# Patient Record
Sex: Female | Born: 1950
Health system: Southern US, Community
[De-identification: ages and names within clinical notes are randomized; demographics above are authoritative.]

## PROBLEM LIST (undated history)

## (undated) DIAGNOSIS — R112 Nausea with vomiting, unspecified: Secondary | ICD-10-CM

## (undated) DIAGNOSIS — Z973 Presence of spectacles and contact lenses: Secondary | ICD-10-CM

## (undated) DIAGNOSIS — T4145XA Adverse effect of unspecified anesthetic, initial encounter: Secondary | ICD-10-CM

## (undated) DIAGNOSIS — Z9889 Other specified postprocedural states: Secondary | ICD-10-CM

## (undated) DIAGNOSIS — Z460 Encounter for fitting and adjustment of spectacles and contact lenses: Secondary | ICD-10-CM

## (undated) DIAGNOSIS — Z789 Other specified health status: Secondary | ICD-10-CM

## (undated) DIAGNOSIS — K469 Unspecified abdominal hernia without obstruction or gangrene: Secondary | ICD-10-CM

## (undated) DIAGNOSIS — T8859XA Other complications of anesthesia, initial encounter: Secondary | ICD-10-CM

## (undated) HISTORY — PX: TONSILLECTOMY: SUR1361

## (undated) HISTORY — DX: Unspecified abdominal hernia without obstruction or gangrene: K46.9

## (undated) HISTORY — PX: CYST EXCISION: SHX5701

## (undated) HISTORY — DX: Presence of spectacles and contact lenses: Z97.3

---

## 1988-07-23 HISTORY — PX: DILATION AND CURETTAGE OF UTERUS: SHX78

## 1993-07-23 HISTORY — PX: CYSTECTOMY: SUR359

## 2003-12-16 ENCOUNTER — Inpatient Hospital Stay (HOSPITAL_COMMUNITY): Admission: AD | Admit: 2003-12-16 | Discharge: 2003-12-18 | Payer: Self-pay | Admitting: Psychiatry

## 2003-12-16 ENCOUNTER — Emergency Department (HOSPITAL_COMMUNITY): Admission: EM | Admit: 2003-12-16 | Discharge: 2003-12-16 | Payer: Self-pay | Admitting: Emergency Medicine

## 2005-04-24 ENCOUNTER — Encounter: Admission: RE | Admit: 2005-04-24 | Discharge: 2005-04-24 | Payer: Self-pay | Admitting: Orthopedic Surgery

## 2010-08-13 ENCOUNTER — Encounter: Payer: Self-pay | Admitting: Orthopedic Surgery

## 2011-01-10 ENCOUNTER — Encounter (INDEPENDENT_AMBULATORY_CARE_PROVIDER_SITE_OTHER): Payer: Self-pay | Admitting: General Surgery

## 2011-01-30 ENCOUNTER — Encounter: Payer: Self-pay | Admitting: Family Medicine

## 2012-05-06 ENCOUNTER — Ambulatory Visit (INDEPENDENT_AMBULATORY_CARE_PROVIDER_SITE_OTHER): Payer: BC Managed Care – PPO | Admitting: General Surgery

## 2012-05-06 ENCOUNTER — Encounter (INDEPENDENT_AMBULATORY_CARE_PROVIDER_SITE_OTHER): Payer: Self-pay | Admitting: General Surgery

## 2012-05-06 ENCOUNTER — Telehealth (INDEPENDENT_AMBULATORY_CARE_PROVIDER_SITE_OTHER): Payer: Self-pay | Admitting: General Surgery

## 2012-05-06 VITALS — BP 134/76 | HR 72 | Temp 98.2°F | Resp 16 | Ht 64.0 in | Wt 95.6 lb

## 2012-05-06 DIAGNOSIS — K4090 Unilateral inguinal hernia, without obstruction or gangrene, not specified as recurrent: Secondary | ICD-10-CM

## 2012-05-06 DIAGNOSIS — M6740 Ganglion, unspecified site: Secondary | ICD-10-CM

## 2012-05-06 DIAGNOSIS — IMO0002 Reserved for concepts with insufficient information to code with codable children: Secondary | ICD-10-CM

## 2012-05-06 NOTE — Telephone Encounter (Signed)
LMOM letting pt know that she has an appt w/ Dr. Teressa Senter on 10/21 at 10:00.

## 2012-05-06 NOTE — Progress Notes (Signed)
Patient ID: Mindy Wilson, female   DOB: [birth year], 61 y.o.   MRN: [id number]  Chief Complaint  Patient presents with  . Cyst    eval cyst on hand and in groin    HPI Mindy Wilson is a 61 y.o. female.   HPI This is a 61 year old female who I saw in the summer of 2012 for a sebaceous cyst at that time. She also had a lesion in her left groin I was not entirely sure if this was a lipoma or hernia. At that time I recommended excision or repair of this. She did not want to do this at this at that time. She comes back in today with a complaint of a left groin mass that has increased in size over that time. It is not really tender. It is somewhat go away when she goes to sleep at night. She is having normal bowel movements without any difficulty. The sebaceous cyst on her back has not recurred and she has no problems with his right now. She also has a cyst on the dorsum of her right hand that is increasing in size. Past Medical History  Diagnosis Date  . Wears glasses   . Hernia   . Osteoporosis     Past Surgical History  Procedure Date  . Cystectomy 1995  . Dilation and curettage of uterus 1990    Family History  Problem Relation Age of Onset  . Cancer Mother     breast  . Cancer Maternal Uncle     colon    Social History History  Substance Use Topics  . Smoking status: Current Every Day Smoker -- 1.0 packs/day  . Smokeless tobacco: Not on file  . Alcohol Use: Yes     very little    Allergies  Allergen Reactions  . Sulfur     No current outpatient prescriptions on file.    Review of Systems Review of Systems  Constitutional: Negative for fever, chills and unexpected weight change.  HENT: Negative for hearing loss, congestion, sore throat, trouble swallowing and voice change.   Eyes: Negative for visual disturbance.  Respiratory: Negative for cough and wheezing.   Cardiovascular: Negative for chest pain, palpitations and leg swelling.  Gastrointestinal: Negative for  nausea, vomiting, abdominal pain, diarrhea, constipation, blood in stool, abdominal distention and anal bleeding.  Genitourinary: Negative for hematuria, vaginal bleeding and difficulty urinating.  Musculoskeletal: Negative for arthralgias.  Skin: Negative for rash and wound.  Neurological: Negative for seizures, syncope and headaches.  Hematological: Negative for adenopathy. Does not bruise/bleed easily.  Psychiatric/Behavioral: Negative for confusion.    Blood pressure 134/76, pulse 72, temperature 98.2 F (36.8 C), temperature source Temporal, resp. rate 16, height 5\' 4"  (1.626 m), weight 95 lb 9.6 oz (43.364 kg).  Physical Exam Physical Exam  Vitals reviewed. Constitutional: She appears well-developed and well-nourished.  Cardiovascular: Normal rate, regular rhythm and normal heart sounds.   Pulmonary/Chest: Effort normal and breath sounds normal. She has no wheezes. She has no rales.  Abdominal: Soft. Normal appearance and bowel sounds are normal. There is no tenderness. A hernia is present. Hernia confirmed positive in the left inguinal area (I am not entirely this is hernia or lipoma, may very well be a left groin hernia, nontender, very mobile thought). Hernia confirmed negative in the right inguinal area.    Data Reviewed Prior office notes  Assessment    Left groin mass, possible hernia    Plan    She would like the  area on her hand excised. I'm going to refer her to hand surgery for evaluation to remove this. I will wait to hear back from her after she sees them.  We also discussed a left groin exploration. I discussed with her if this is a lipoma I will excise it. We also discussed the open inguinal hernia repair if there is indeed a hernia there. I told her I think we should do this in the fairly near future as if it is a hernia she certainly has some risk associated with it.  We discussed  an open inguinal hernia repair. I described the procedure in detail.  The  patient was given educational material.  Goals should be achieved with surgery. We discussed the usage of mesh and the rationale behind that. We went over the pathophysiology of inguinal hernia. We have elected to perform open inguinal hernia repair with mesh.  We discussed the risks including bleeding, infection, recurrence, postoperative pain and chronic groin pain.         Mindy Wilson 05/06/2012, 1:54 PM

## 2012-05-13 ENCOUNTER — Other Ambulatory Visit (INDEPENDENT_AMBULATORY_CARE_PROVIDER_SITE_OTHER): Payer: Self-pay | Admitting: General Surgery

## 2012-06-23 ENCOUNTER — Encounter (HOSPITAL_BASED_OUTPATIENT_CLINIC_OR_DEPARTMENT_OTHER): Payer: Self-pay | Admitting: *Deleted

## 2012-06-23 NOTE — Progress Notes (Signed)
To come in for [facility] labs Denies any heart or resp problems Told to hydrate well-hx bp low with anesthesia

## 2012-06-24 ENCOUNTER — Encounter (HOSPITAL_BASED_OUTPATIENT_CLINIC_OR_DEPARTMENT_OTHER)
Admission: RE | Admit: 2012-06-24 | Discharge: 2012-06-24 | Disposition: A | Discharge status: Home / Self Care | Payer: BC Managed Care – PPO | Source: Ambulatory Visit | Attending: General Surgery | Admitting: General Surgery

## 2012-06-24 LAB — BASIC METABOLIC PANEL
BUN: 20 mg/dL (ref 6–23)
CO2: 27 mEq/L (ref 19–32)
Calcium: 9.7 mg/dL (ref 8.4–10.5)
Chloride: 103 mEq/L (ref 96–112)
Creatinine, Ser: 0.77 mg/dL (ref 0.50–1.10)
GFR calc Af Amer: >90 mL/min (ref >90)
GFR calc non Af Amer: 89 mL/min — ABNORMAL LOW (ref >90)
Glucose, Bld: 64 mg/dL — ABNORMAL LOW (ref 70–99)
Potassium: 3.9 mEq/L (ref 3.5–5.1)
Sodium: 140 mEq/L (ref 135–145)

## 2012-06-24 LAB — CBC
HCT: 41.2 % (ref 36.0–46.0)
Hemoglobin: 14.2 g/dL (ref 12.0–15.0)
MCH: 32.7 pg (ref 26.0–34.0)
MCHC: 34.5 g/dL (ref 30.0–36.0)
MCV: 94.9 fL (ref 78.0–100.0)
Platelets: 237 10*3/uL (ref 150–400)
RBC: 4.34 MIL/uL (ref 3.87–5.11)
RDW: 13.2 % (ref 11.5–15.5)
WBC: 6.1 10*3/uL (ref 4.0–10.5)

## 2012-06-24 LAB — DIFFERENTIAL
Basophils Absolute: 0 10*3/uL (ref 0.0–0.1)
Basophils Relative: 1 % (ref 0–1)
Eosinophils Absolute: 0.1 10*3/uL (ref 0.0–0.7)
Eosinophils Relative: 2 % (ref 0–5)
Lymphocytes Relative: 32 % (ref 12–46)
Lymphs Abs: 2 10*3/uL (ref 0.7–4.0)
Monocytes Absolute: 0.7 10*3/uL (ref 0.1–1.0)
Monocytes Relative: 12 % (ref 3–12)
Neutro Abs: 3.3 10*3/uL (ref 1.7–7.7)
Neutrophils Relative %: 54 % (ref 43–77)

## 2012-06-25 ENCOUNTER — Encounter (HOSPITAL_BASED_OUTPATIENT_CLINIC_OR_DEPARTMENT_OTHER): Payer: Self-pay

## 2012-06-25 ENCOUNTER — Encounter (HOSPITAL_BASED_OUTPATIENT_CLINIC_OR_DEPARTMENT_OTHER)
Admission: RE | Disposition: A | Discharge status: Home / Self Care | Payer: Self-pay | Source: Ambulatory Visit | Attending: General Surgery

## 2012-06-25 ENCOUNTER — Encounter (HOSPITAL_BASED_OUTPATIENT_CLINIC_OR_DEPARTMENT_OTHER): Payer: Self-pay | Admitting: Certified Registered Nurse Anesthetist

## 2012-06-25 ENCOUNTER — Encounter (HOSPITAL_BASED_OUTPATIENT_CLINIC_OR_DEPARTMENT_OTHER): Payer: Self-pay | Admitting: Anesthesiology

## 2012-06-25 ENCOUNTER — Ambulatory Visit (HOSPITAL_BASED_OUTPATIENT_CLINIC_OR_DEPARTMENT_OTHER): Payer: BC Managed Care – PPO | Admitting: Certified Registered Nurse Anesthetist

## 2012-06-25 ENCOUNTER — Ambulatory Visit (HOSPITAL_BASED_OUTPATIENT_CLINIC_OR_DEPARTMENT_OTHER)
Admission: RE | Admit: 2012-06-25 | Discharge: 2012-06-25 | Disposition: A | Discharge status: Home / Self Care | Payer: BC Managed Care – PPO | Source: Ambulatory Visit | Attending: General Surgery | Admitting: General Surgery

## 2012-06-25 DIAGNOSIS — Z800 Family history of malignant neoplasm of digestive organs: Secondary | ICD-10-CM | POA: Insufficient documentation

## 2012-06-25 DIAGNOSIS — M810 Age-related osteoporosis without current pathological fracture: Secondary | ICD-10-CM | POA: Insufficient documentation

## 2012-06-25 DIAGNOSIS — K4190 Unilateral femoral hernia, without obstruction or gangrene, not specified as recurrent: Secondary | ICD-10-CM

## 2012-06-25 DIAGNOSIS — K4090 Unilateral inguinal hernia, without obstruction or gangrene, not specified as recurrent: Secondary | ICD-10-CM

## 2012-06-25 DIAGNOSIS — Z882 Allergy status to sulfonamides status: Secondary | ICD-10-CM | POA: Insufficient documentation

## 2012-06-25 DIAGNOSIS — F17200 Nicotine dependence, unspecified, uncomplicated: Secondary | ICD-10-CM | POA: Insufficient documentation

## 2012-06-25 DIAGNOSIS — Z803 Family history of malignant neoplasm of breast: Secondary | ICD-10-CM | POA: Insufficient documentation

## 2012-06-25 HISTORY — DX: Encounter for fitting and adjustment of spectacles and contact lenses: Z46.0

## 2012-06-25 HISTORY — PX: INSERTION OF MESH: SHX5868

## 2012-06-25 HISTORY — DX: Adverse effect of unspecified anesthetic, initial encounter: T41.45XA

## 2012-06-25 HISTORY — DX: Other specified postprocedural states: R11.2

## 2012-06-25 HISTORY — DX: Other complications of anesthesia, initial encounter: T88.59XA

## 2012-06-25 HISTORY — DX: Nausea with vomiting, unspecified: Z98.890

## 2012-06-25 HISTORY — PX: INGUINAL HERNIA REPAIR: SHX194

## 2012-06-25 HISTORY — DX: Other specified health status: Z78.9

## 2012-06-25 SURGERY — REPAIR, HERNIA, INGUINAL, ADULT
Anesthesia: Regional | Laterality: Left | Wound class: Clean

## 2012-06-25 MED ORDER — 0.9 % SODIUM CHLORIDE (POUR BTL) OPTIME
TOPICAL | Status: DC | PRN
  Administered 2012-06-25: 1000 mL

## 2012-06-25 MED ORDER — BUPIVACAINE HCL (PF) 0.25 % IJ SOLN
INTRAMUSCULAR | Status: DC | PRN
  Administered 2012-06-25: 6 mL

## 2012-06-25 MED ORDER — BUPIVACAINE-EPINEPHRINE PF 0.5-1:200000 % IJ SOLN
INTRAMUSCULAR | Status: DC | PRN
  Administered 2012-06-25: 25 mL

## 2012-06-25 MED ORDER — ONDANSETRON HCL 4 MG/2ML IJ SOLN
INTRAMUSCULAR | Status: DC | PRN
  Administered 2012-06-25: 4 mg via INTRAVENOUS

## 2012-06-25 MED ORDER — EPHEDRINE SULFATE 50 MG/ML IJ SOLN
INTRAMUSCULAR | Status: DC | PRN
  Administered 2012-06-25: 10 mg via INTRAVENOUS

## 2012-06-25 MED ORDER — SCOPOLAMINE 1 MG/3DAYS TD PT72
1.0000 | MEDICATED_PATCH | TRANSDERMAL | Status: DC
  Administered 2012-06-25: 1.5 mg via TRANSDERMAL

## 2012-06-25 MED ORDER — LIDOCAINE HCL (CARDIAC) 20 MG/ML IV SOLN
INTRAVENOUS | Status: DC | PRN
  Administered 2012-06-25: 60 mg via INTRAVENOUS

## 2012-06-25 MED ORDER — HYDROMORPHONE HCL PF 1 MG/ML IJ SOLN
0.2500 mg | INTRAMUSCULAR | Status: DC | PRN
  Administered 2012-06-25: 0.5 mg via INTRAVENOUS

## 2012-06-25 MED ORDER — FENTANYL CITRATE 0.05 MG/ML IJ SOLN
50.0000 ug | INTRAMUSCULAR | Status: DC | PRN
  Administered 2012-06-25: 50 ug via INTRAVENOUS

## 2012-06-25 MED ORDER — DEXAMETHASONE SODIUM PHOSPHATE 4 MG/ML IJ SOLN
INTRAMUSCULAR | Status: DC | PRN
  Administered 2012-06-25: 10 mg via INTRAVENOUS

## 2012-06-25 MED ORDER — LACTATED RINGERS IV SOLN
INTRAVENOUS | Status: DC
  Administered 2012-06-25 (×2): via INTRAVENOUS

## 2012-06-25 MED ORDER — CEFAZOLIN SODIUM-DEXTROSE 2-3 GM-% IV SOLR
2.0000 g | INTRAVENOUS | Status: AC
  Administered 2012-06-25: 2 g via INTRAVENOUS

## 2012-06-25 MED ORDER — MIDAZOLAM HCL 5 MG/5ML IJ SOLN
INTRAMUSCULAR | Status: DC | PRN
  Administered 2012-06-25: 1 mg via INTRAVENOUS

## 2012-06-25 MED ORDER — MIDAZOLAM HCL 2 MG/2ML IJ SOLN
0.5000 mg | INTRAMUSCULAR | Status: DC | PRN
  Administered 2012-06-25: 2 mg via INTRAVENOUS

## 2012-06-25 MED ORDER — ACETAMINOPHEN 10 MG/ML IV SOLN
1000.0000 mg | Freq: Once | INTRAVENOUS | Status: AC
  Administered 2012-06-25: 900 mg via INTRAVENOUS

## 2012-06-25 MED ORDER — OXYCODONE HCL 5 MG PO TABS: 5.0000 mg | ORAL_TABLET | Freq: Once | ORAL | Status: DC | PRN

## 2012-06-25 MED ORDER — HYDROCODONE-ACETAMINOPHEN 10-325 MG PO TABS: 1.0000 | ORAL_TABLET | Freq: Four times a day (QID) | ORAL | Status: DC | PRN

## 2012-06-25 MED ORDER — FENTANYL CITRATE 0.05 MG/ML IJ SOLN
INTRAMUSCULAR | Status: DC | PRN
  Administered 2012-06-25: 25 ug via INTRAVENOUS

## 2012-06-25 MED ORDER — PROPOFOL 10 MG/ML IV BOLUS
INTRAVENOUS | Status: DC | PRN
  Administered 2012-06-25: 150 mg via INTRAVENOUS

## 2012-06-25 MED ORDER — OXYCODONE HCL 5 MG/5ML PO SOLN: 5.0000 mg | Freq: Once | ORAL | Status: DC | PRN

## 2012-06-25 SURGICAL SUPPLY — 42 items
BLADE SURG 15 STRL LF DISP TIS (BLADE) ×1 IMPLANT
BLADE SURG 15 STRL SS (BLADE) ×1
BLADE SURG ROTATE 9660 (MISCELLANEOUS) IMPLANT
CHLORAPREP W/TINT 26ML (MISCELLANEOUS) ×2 IMPLANT
CLOTH BEACON ORANGE TIMEOUT ST (SAFETY) ×2 IMPLANT
COVER MAYO STAND STRL (DRAPES) ×2 IMPLANT
COVER TABLE BACK 60X90 (DRAPES) ×2 IMPLANT
DECANTER SPIKE VIAL GLASS SM (MISCELLANEOUS) IMPLANT
DERMABOND ADVANCED (GAUZE/BANDAGES/DRESSINGS) ×1
DERMABOND ADVANCED .7 DNX12 (GAUZE/BANDAGES/DRESSINGS) ×1 IMPLANT
DRAIN PENROSE 1/2X12 LTX STRL (WOUND CARE) ×2 IMPLANT
DRAPE PED LAPAROTOMY (DRAPES) ×2 IMPLANT
ELECT COATED BLADE 2.86 ST (ELECTRODE) ×2 IMPLANT
ELECT REM PT RETURN 9FT ADLT (ELECTROSURGICAL) ×2
ELECTRODE REM PT RTRN 9FT ADLT (ELECTROSURGICAL) ×1 IMPLANT
GLOVE BIO SURGEON STRL SZ 6.5 (GLOVE) ×2 IMPLANT
GLOVE BIO SURGEON STRL SZ7 (GLOVE) ×2 IMPLANT
GLOVE BIOGEL PI IND STRL 7.5 (GLOVE) ×1 IMPLANT
GLOVE BIOGEL PI INDICATOR 7.5 (GLOVE) ×1
GLOVE INDICATOR 7.0 STRL GRN (GLOVE) ×2 IMPLANT
GOWN PREVENTION PLUS XLARGE (GOWN DISPOSABLE) ×4 IMPLANT
MESH HERNIA SYS ULTRAPRO LRG (Mesh General) ×2 IMPLANT
NEEDLE HYPO 22GX1.5 SAFETY (NEEDLE) ×2 IMPLANT
NS IRRIG 1000ML POUR BTL (IV SOLUTION) ×2 IMPLANT
PACK BASIN DAY SURGERY FS (CUSTOM PROCEDURE TRAY) ×2 IMPLANT
PENCIL BUTTON HOLSTER BLD 10FT (ELECTRODE) ×2 IMPLANT
SLEEVE SCD COMPRESS KNEE MED (MISCELLANEOUS) IMPLANT
SPONGE LAP 4X18 X RAY DECT (DISPOSABLE) ×2 IMPLANT
STRIP CLOSURE SKIN 1/2X4 (GAUZE/BANDAGES/DRESSINGS) IMPLANT
SUT MNCRL AB 4-0 PS2 18 (SUTURE) ×2 IMPLANT
SUT PROLENE 2 0 CT2 30 (SUTURE) ×4 IMPLANT
SUT SILK 2 0 SH (SUTURE) IMPLANT
SUT VIC AB 0 SH 27 (SUTURE) IMPLANT
SUT VIC AB 2-0 SH 27 (SUTURE) ×2
SUT VIC AB 2-0 SH 27XBRD (SUTURE) ×2 IMPLANT
SUT VIC AB 3-0 SH 27 (SUTURE) ×1
SUT VIC AB 3-0 SH 27X BRD (SUTURE) ×1 IMPLANT
SUT VICRYL AB 3 0 TIES (SUTURE) ×2 IMPLANT
SYR CONTROL 10ML LL (SYRINGE) ×2 IMPLANT
TOWEL OR 17X24 6PK STRL BLUE (TOWEL DISPOSABLE) ×4 IMPLANT
TOWEL OR NON WOVEN STRL DISP B (DISPOSABLE) ×2 IMPLANT
WATER STERILE IRR 1000ML POUR (IV SOLUTION) IMPLANT

## 2012-06-25 NOTE — Progress Notes (Signed)
Assisted Dr. Sampson Goon with left, ultrasound guided transabd plane block. Side rails up, monitors on throughout procedure. See vital signs in flow sheet. Tolerated Procedure well.

## 2012-06-25 NOTE — Anesthesia Postprocedure Evaluation (Signed)
  Anesthesia Post-op Note  Patient: Mindy Wilson  Procedure(s) Performed: Procedure(s) (LRB) with comments: HERNIA REPAIR INGUINAL ADULT (Left) INSERTION OF MESH (Left)  Patient Location: PACU  Anesthesia Type:GA combined with regional for post-op pain  Level of Consciousness: awake and alert   Airway and Oxygen Therapy: Patient Spontanous Breathing and Patient connected to face mask oxygen  Post-op Pain: mild  Post-op Assessment: Post-op Vital signs reviewed, Patient's Cardiovascular Status Stable, Respiratory Function Stable, Patent Airway and No signs of Nausea or vomiting  Post-op Vital Signs: Reviewed and stable  Complications: No apparent anesthesia complications

## 2012-06-25 NOTE — Anesthesia Preprocedure Evaluation (Signed)
Anesthesia Evaluation  Patient identified by MRN, date of birth, ID band Patient awake    Reviewed: Allergy & Precautions, H&P , NPO status , Patient's Chart, lab work & pertinent test results  History of Anesthesia Complications (+) PONV  Airway Mallampati: II TM Distance: >3 FB Neck ROM: Full    Dental No notable dental hx. (+) Teeth Intact and Dental Advisory Given   Pulmonary neg pulmonary ROS,  breath sounds clear to auscultation  Pulmonary exam normal       Cardiovascular negative cardio ROS  Rhythm:Regular Rate:Normal     Neuro/Psych negative neurological ROS  negative psych ROS   GI/Hepatic negative GI ROS, Neg liver ROS,   Endo/Other  negative endocrine ROS  Renal/GU negative Renal ROS  negative genitourinary   Musculoskeletal   Abdominal   Peds  Hematology negative hematology ROS (+)   Anesthesia Other Findings   Reproductive/Obstetrics negative OB ROS                           Anesthesia Physical Anesthesia Plan  ASA: II  Anesthesia Plan: General and Regional   Post-op Pain Management:    Induction: Intravenous  Airway Management Planned: LMA  Additional Equipment:   Intra-op Plan:   Post-operative Plan: Extubation in OR  Informed Consent: I have reviewed the patients History and Physical, chart, labs and discussed the procedure including the risks, benefits and alternatives for the proposed anesthesia with the patient or authorized representative who has indicated his/her understanding and acceptance.   Dental advisory given  Plan Discussed with: CRNA  Anesthesia Plan Comments:         Anesthesia Quick Evaluation

## 2012-06-25 NOTE — Op Note (Signed)
Preoperative diagnosis: Left inguinal hernia Postoperative diagnosis: #1 left inguinal hernia #2 left femoral hernia Procedure: Femoral and inguinal hernia repair with UltraPro hernia system Surgeon: Dr. Harden Mo Anesthesia: Gen. With LMA, Block Estimated blood loss: Minimal Complications: None Specimens: None Sponge and needle count correct at end of operation Disposition to recovery stable  Indications: This is a 61 year old female with a long-standing history of a left groin mass. This area clinically and on exam appears to be a hernia. We discussed a left inguinal hernia repair with mesh. We discussed the risks and benefits prior to beginning.  Procedure: After informed consent was obtained the patient first had a TAP block placed by Dr. Sampson Goon. She was then taken to the operating room. She had cefazolin ministered. She has sequential compression devices on her legs. She was in place a general anesthesia with an LMA. Her left groin was then prepped and draped in the standard sterile surgical fashion. Surgical timeout was performed.  I infiltrated quarter percent Marcaine in the skin of the left groin. An incision. I ligated the superficial epigastric vein with 3-0 Vicryl ties. Then identified the external oblique. I entered this sharply through the external ring. I then ligated and divided the round ligament. I then noted her to have a pantaloon hernia that involved both the direct and indirect components surrounding the epigastric vessels. She had also a fairly large femoral hernia that reduced in its entirety. I then developed the preperitoneal space. This had reduced all of the hernia completely and was able to view the entire myopectineal orifice. I then decided to place an UltraPro hernia system. I placed the bottom portion of bilayer  flat in the preperitoneal space giving good coverage of my femoral space. I then closed the internal oblique fascia down to the bone. Laterally I  then transitioned this to the shelving edge. This completely obliterated the space. I closed the femoral defect down with a 2-0 Vicryl as well. I then laid the top portion of the bilayer flap. I sewed this to the pubic tubercle with 2-0 Prolene in several positions. I tacked this with 2-0 Prolene to the shelving edge throughout that length as well. I laid the lateral portions underneath the external oblique. I tacked it to the internal oblique superiorly the mesh was in good position. Hemostasis was observed. I then closed the external oblique with 2-0 Vicryl. I closed Scarpa's with 3-0 Vicryl. The skin was closed for Monocryl and Dermabond. She tolerated this well was extubated and transferred to recovery stable.

## 2012-06-25 NOTE — Interval H&P Note (Signed)
History and Physical Interval Note:  06/25/2012 10:03 AM  Mindy Wilson  has presented today for surgery, with the diagnosis of left groin mass  The various methods of treatment have been discussed with the patient and family. After consideration of risks, benefits and other options for treatment, the patient has consented to  Procedure(s) (LRB) with comments: HERNIA REPAIR INGUINAL ADULT (Left) - left inguinal hernia repair with mesh vs left groin mass excision INSERTION OF MESH (Left) as a surgical intervention .  The patient's history has been reviewed, patient examined, no change in status, stable for surgery.  I have reviewed the patient's chart and labs.  Questions were answered to the patient's satisfaction.     [name]

## 2012-06-25 NOTE — H&P (Signed)
HPI  This is a 61 year old female who I saw in the summer of 2012 for a sebaceous cyst at that time. She also had a lesion in her left groin I was not entirely sure if this was a lipoma or hernia. At that time I recommended excision or repair of this. She did not want to do this at this at that time. She comes back in today with a complaint of a left groin mass that has increased in size over that time. It is not really tender. It is somewhat go away when she goes to sleep at night. She is having normal bowel movements without any difficulty. The sebaceous cyst on her back has not recurred and she has no problems with his right now. She also has a cyst on the dorsum of her right hand that is increasing in size.   Past Medical History   Diagnosis  Date   .  Wears glasses    .  Hernia    .  Osteoporosis     Past Surgical History   Procedure  Date   .  Cystectomy  1995   .  Dilation and curettage of uterus  1990    Family History   Problem  Relation  Age of Onset   .  Cancer  Mother       breast    .  Cancer  Maternal Uncle       colon    Social History  History   Substance Use Topics   .  Smoking status:  Current Every Day Smoker -- 1.0 packs/day   .  Smokeless tobacco:  Not on file   .  Alcohol Use:  Yes      very little    Allergies   Allergen  Reactions   .  Sulfur     No current outpatient prescriptions on file.     Blood pressure 134/76, pulse 72, temperature 98.2 F (36.8 C), temperature source Temporal, resp. rate 16, height 5\' 4"  (1.626 m), weight 95 lb 9.6 oz (43.364 kg).  Physical Exam  Physical Exam  Vitals reviewed.  Constitutional: She appears well-developed and well-nourished.  Cardiovascular: Normal rate, regular rhythm and normal heart sounds.  Pulmonary/Chest: Effort normal and breath sounds normal. She has no wheezes. She has no rales.  Abdominal: Soft. Normal appearance and bowel sounds are normal. There is no tenderness. A hernia is present. Hernia  confirmed positive in the left inguinal area (I am not entirely this is hernia or lipoma, may very well be a left groin hernia, nontender, very mobile though). Hernia confirmed negative in the right inguinal area.   Left groin mass, possible hernia   Plan   She would like the area on her hand excised. I'm going to refer her to hand surgery for evaluation to remove this. I will wait to hear back from her after she sees them.  We also discussed a left groin exploration. I discussed with her if this is a lipoma I will excise it. We also discussed the open inguinal hernia repair if there is indeed a hernia there. I told her I think we should do this in the fairly near future as if it is a hernia she certainly has some risk associated with it.  We discussed an open inguinal hernia repair. I described the procedure in detail. The patient was given educational material. Goals should be achieved with surgery. We discussed the usage of mesh and the rationale behind  that. We went over the pathophysiology of inguinal hernia. We have elected to perform open inguinal hernia repair with mesh. We discussed the risks including bleeding, infection, recurrence, postoperative pain and chronic groin pain.

## 2012-06-25 NOTE — Anesthesia Procedure Notes (Addendum)
Anesthesia Regional Block:  TAP block  Pre-Anesthetic Checklist: ,, timeout performed, Correct Patient, Correct Site, Correct Laterality, Correct Procedure, Correct Position, site marked, Risks and benefits discussed, pre-op evaluation, post-op pain management  Laterality: Left  Prep: chloraprep       Needles:  Injection technique: Single-shot  Needle Type: Echogenic Stimulator Needle          Additional Needles:  Procedures: ultrasound guided (picture in chart) TAP block Narrative:  Start time: 06/25/2012 9:17 AM End time: 06/25/2012 9:27 AM Injection made incrementally with aspirations every 5 mL.  Performed by: Personally  Anesthesiologist: Fitzgerald,MD  Additional Notes: 2% Lidocaine for skin.   Procedure Name: LMA Insertion Date/Time: 06/25/2012 10:14 AM Performed by: Nasya Vincent D Pre-anesthesia Checklist: Patient identified, Emergency Drugs available, Suction available and Patient being monitored Patient Re-evaluated:Patient Re-evaluated prior to inductionOxygen Delivery Method: Circle System Utilized Preoxygenation: Pre-oxygenation with 100% oxygen Intubation Type: IV induction Ventilation: Mask ventilation without difficulty LMA: LMA inserted LMA Size: 4.0 Number of attempts: 1 Airway Equipment and Method: bite block Placement Confirmation: positive ETCO2 Tube secured with: Tape Dental Injury: Teeth and Oropharynx as per pre-operative assessment

## 2012-06-25 NOTE — Transfer of Care (Signed)
Immediate Anesthesia Transfer of Care Note  Patient: Mindy Wilson  Procedure(s) Performed: Procedure(s) (LRB) with comments: HERNIA REPAIR INGUINAL ADULT (Left) INSERTION OF MESH (Left)  Patient Location: PACU  Anesthesia Type:GA combined with regional for post-op pain  Level of Consciousness: sedated  Airway & Oxygen Therapy: Patient Spontanous Breathing and Patient connected to face mask oxygen  Post-op Assessment: Report given to PACU RN and Post -op Vital signs reviewed and stable  Post vital signs: Reviewed and stable  Complications: No apparent anesthesia complications

## 2012-06-26 ENCOUNTER — Encounter (HOSPITAL_BASED_OUTPATIENT_CLINIC_OR_DEPARTMENT_OTHER): Payer: Self-pay | Admitting: General Surgery

## 2012-07-11 ENCOUNTER — Ambulatory Visit (INDEPENDENT_AMBULATORY_CARE_PROVIDER_SITE_OTHER): Payer: BC Managed Care – PPO | Admitting: General Surgery

## 2012-07-11 ENCOUNTER — Encounter (INDEPENDENT_AMBULATORY_CARE_PROVIDER_SITE_OTHER): Payer: Self-pay | Admitting: General Surgery

## 2012-07-11 DIAGNOSIS — Z09 Encounter for follow-up examination after completed treatment for conditions other than malignant neoplasm: Secondary | ICD-10-CM

## 2012-07-11 NOTE — Progress Notes (Signed)
Subjective:     Patient ID: Mindy Wilson, female   DOB: [birth year], 61 y.o.   MRN: [id number]  HPI This is a 61 year old female who I repaired a left inguinal and femoral hernia on early in December. She reports some swelling as well as some numbness in this area but is otherwise doing well. She slowly is increasing her activity. She is having normal bowel movements and eating well.  Review of Systems     Objective:   Physical Exam    well healing left groin incision without infection, mild edema Assessment:     S/p left inguinal and femoral hernia    Plan:     She is recovered as expected. I told her in January she can be released to full activity. I told her if the symptoms do not resolve in the next 6 weeks to come back and see me otherwise I will see her as needed

## 2013-05-15 ENCOUNTER — Emergency Department (HOSPITAL_COMMUNITY): Payer: No Typology Code available for payment source

## 2013-05-15 ENCOUNTER — Inpatient Hospital Stay (HOSPITAL_COMMUNITY)
Admission: EM | Admit: 2013-05-15 | Discharge: 2013-05-18 | DRG: 193 | Disposition: A | Discharge status: Home / Self Care | Payer: No Typology Code available for payment source | Attending: Internal Medicine | Admitting: Internal Medicine

## 2013-05-15 ENCOUNTER — Encounter (HOSPITAL_COMMUNITY): Payer: Self-pay | Admitting: Emergency Medicine

## 2013-05-15 DIAGNOSIS — Z720 Tobacco use: Secondary | ICD-10-CM

## 2013-05-15 DIAGNOSIS — F17200 Nicotine dependence, unspecified, uncomplicated: Secondary | ICD-10-CM | POA: Diagnosis present

## 2013-05-15 DIAGNOSIS — J189 Pneumonia, unspecified organism: Principal | ICD-10-CM

## 2013-05-15 DIAGNOSIS — R5381 Other malaise: Secondary | ICD-10-CM | POA: Diagnosis present

## 2013-05-15 DIAGNOSIS — E43 Unspecified severe protein-calorie malnutrition: Secondary | ICD-10-CM | POA: Insufficient documentation

## 2013-05-15 DIAGNOSIS — R0902 Hypoxemia: Secondary | ICD-10-CM

## 2013-05-15 DIAGNOSIS — J9600 Acute respiratory failure, unspecified whether with hypoxia or hypercapnia: Secondary | ICD-10-CM

## 2013-05-15 DIAGNOSIS — Z681 Body mass index (BMI) 19 or less, adult: Secondary | ICD-10-CM

## 2013-05-15 DIAGNOSIS — R609 Edema, unspecified: Secondary | ICD-10-CM | POA: Diagnosis present

## 2013-05-15 DIAGNOSIS — M810 Age-related osteoporosis without current pathological fracture: Secondary | ICD-10-CM | POA: Diagnosis present

## 2013-05-15 DIAGNOSIS — E876 Hypokalemia: Secondary | ICD-10-CM | POA: Diagnosis present

## 2013-05-15 LAB — COMPREHENSIVE METABOLIC PANEL
ALT: 14 U/L (ref 0–35)
AST: 12 U/L (ref 0–37)
Albumin: 2.6 g/dL — ABNORMAL LOW (ref 3.5–5.2)
CO2: 26 mEq/L (ref 19–32)
Chloride: 98 mEq/L (ref 96–112)
GFR calc non Af Amer: >90 mL/min (ref >90)
Sodium: 137 mEq/L (ref 135–145)
Total Bilirubin: 0.6 mg/dL (ref 0.3–1.2)

## 2013-05-15 LAB — CBC
Platelets: 376 10*3/uL (ref 150–400)
RBC: 3.89 MIL/uL (ref 3.87–5.11)
RDW: 13.5 % (ref 11.5–15.5)
WBC: 14.8 10*3/uL — ABNORMAL HIGH (ref 4.0–10.5)

## 2013-05-15 MED ORDER — DEXTROSE 5 % IV SOLN
500.0000 mg | Freq: Once | INTRAVENOUS | Status: AC
  Administered 2013-05-15: 500 mg via INTRAVENOUS

## 2013-05-15 MED ORDER — SODIUM CHLORIDE 0.9 % IV SOLN
INTRAVENOUS | Status: DC
  Administered 2013-05-15: 20:00:00 via INTRAVENOUS

## 2013-05-15 MED ORDER — AZITHROMYCIN 500 MG PO TABS
500.0000 mg | ORAL_TABLET | ORAL | Status: DC
  Administered 2013-05-16 – 2013-05-17 (×2): 500 mg via ORAL
  Filled 2013-05-15 (×3): qty 1

## 2013-05-15 MED ORDER — NICOTINE 21 MG/24HR TD PT24
21.0000 mg | MEDICATED_PATCH | Freq: Every day | TRANSDERMAL | Status: DC
  Administered 2013-05-15 – 2013-05-18 (×4): 21 mg via TRANSDERMAL
  Filled 2013-05-15 (×4): qty 1

## 2013-05-15 MED ORDER — OXYCODONE HCL 5 MG PO TABS
5.0000 mg | ORAL_TABLET | ORAL | Status: DC | PRN
  Administered 2013-05-17: 5 mg via ORAL
  Filled 2013-05-15: qty 1

## 2013-05-15 MED ORDER — ACETAMINOPHEN 325 MG PO TABS
650.0000 mg | ORAL_TABLET | Freq: Four times a day (QID) | ORAL | Status: DC | PRN
  Administered 2013-05-15 – 2013-05-16 (×2): 650 mg via ORAL
  Filled 2013-05-15 (×2): qty 2

## 2013-05-15 MED ORDER — LORAZEPAM 1 MG PO TABS
1.0000 mg | ORAL_TABLET | Freq: Once | ORAL | Status: AC
  Administered 2013-05-15: 1 mg via ORAL
  Filled 2013-05-15: qty 1

## 2013-05-15 MED ORDER — SODIUM CHLORIDE 0.9 % IV SOLN
INTRAVENOUS | Status: DC
  Administered 2013-05-15: 15:00:00 via INTRAVENOUS

## 2013-05-15 MED ORDER — DM-GUAIFENESIN ER 30-600 MG PO TB12
1.0000 | ORAL_TABLET | Freq: Two times a day (BID) | ORAL | Status: DC
  Administered 2013-05-15 – 2013-05-17 (×5): 1 via ORAL
  Filled 2013-05-15 (×8): qty 1

## 2013-05-15 MED ORDER — ALBUTEROL SULFATE (5 MG/ML) 0.5% IN NEBU: 2.5000 mg | INHALATION_SOLUTION | RESPIRATORY_TRACT | Status: DC | PRN

## 2013-05-15 MED ORDER — ONDANSETRON HCL 4 MG PO TABS: 4.0000 mg | ORAL_TABLET | Freq: Four times a day (QID) | ORAL | Status: DC | PRN

## 2013-05-15 MED ORDER — ENOXAPARIN SODIUM 30 MG/0.3ML ~~LOC~~ SOLN
30.0000 mg | SUBCUTANEOUS | Status: DC
  Administered 2013-05-15 – 2013-05-17 (×3): 30 mg via SUBCUTANEOUS
  Filled 2013-05-15 (×4): qty 0.3

## 2013-05-15 MED ORDER — DEXTROSE 5 % IV SOLN
1.0000 g | INTRAVENOUS | Status: DC
  Administered 2013-05-16 – 2013-05-17 (×2): 1 g via INTRAVENOUS
  Filled 2013-05-15 (×3): qty 10

## 2013-05-15 MED ORDER — IPRATROPIUM BROMIDE 0.02 % IN SOLN
0.5000 mg | RESPIRATORY_TRACT | Status: AC
  Administered 2013-05-15 (×3): 0.5 mg via RESPIRATORY_TRACT
  Filled 2013-05-15 (×3): qty 2.5

## 2013-05-15 MED ORDER — ALBUTEROL SULFATE (5 MG/ML) 0.5% IN NEBU
5.0000 mg | INHALATION_SOLUTION | RESPIRATORY_TRACT | Status: AC
  Administered 2013-05-15 (×3): 5 mg via RESPIRATORY_TRACT
  Filled 2013-05-15 (×3): qty 1

## 2013-05-15 MED ORDER — ONDANSETRON HCL 4 MG/2ML IJ SOLN: 4.0000 mg | Freq: Four times a day (QID) | INTRAMUSCULAR | Status: DC | PRN

## 2013-05-15 MED ORDER — CEFTRIAXONE SODIUM 1 G IJ SOLR
1.0000 g | Freq: Once | INTRAMUSCULAR | Status: AC
  Administered 2013-05-15: 1 g via INTRAVENOUS
  Filled 2013-05-15: qty 10

## 2013-05-15 MED ORDER — ACETAMINOPHEN 650 MG RE SUPP: 650.0000 mg | Freq: Four times a day (QID) | RECTAL | Status: DC | PRN

## 2013-05-15 MED ORDER — SENNOSIDES-DOCUSATE SODIUM 8.6-50 MG PO TABS: 1.0000 | ORAL_TABLET | Freq: Every evening | ORAL | Status: DC | PRN

## 2013-05-15 NOTE — ED Provider Notes (Signed)
TIME SEEN: 3:00 PM  CHIEF COMPLAINT: Shortness of breath, cough, subjective fever  HPI: Patient is a 62 year old female with a history of tobacco use who presents the emergency department from her primary care doctor's office with several days of subjective fever, chills, dry cough. She was seen by her primary care physician on Monday, 4 days ago and was diagnosed with "lung infection". She was started on an antibiotic but she cannot recall the name of this antibiotic. She states that she progressively was getting worse and she was seen by her primary care doctor Dr. Jeannetta Nap in [place] today. At her primary care doctor's office she had an oxygen saturation in the mid 80s. She was sent to the emergency department. She does not use home oxygen. She does not have a history of asthma or COPD. Does not use breathing treatments. No sick contacts or recent travel. She denies any prior history of PE, DVT, recent lower extremity swelling, prolonged immobilization, surgery, trauma, fracture, history of cancer, exogenous hormone use.  ROS: See HPI Constitutional: Subjective fever  Eyes: no drainage  ENT: no runny nose   Cardiovascular:  Chest pain with coughing Resp: SOB  GI: no vomiting GU: no dysuria Integumentary: no rash  Allergy: no hives  Musculoskeletal: no leg swelling  Neurological: no slurred speech ROS otherwise negative  PAST MEDICAL HISTORY/PAST SURGICAL HISTORY:  Past Medical History  Diagnosis Date  . Wears glasses   . Hernia   . Osteoporosis   . Complication of anesthesia     bp dropped with d/c  . PONV (postoperative nausea and vomiting)   . Contact lens/glasses fitting   . No pertinent past medical history     MEDICATIONS:  Prior to Admission medications   Medication Sig Start Date End Date Taking? Authorizing Provider  dextromethorphan-guaiFENesin (MUCINEX DM) 30-600 MG per 12 hr tablet Take 1 tablet by mouth every 12 (twelve) hours.   Yes Historical Provider, MD   doxycycline (ADOXA) 100 MG tablet Take 100 mg by mouth 2 (two) times daily. 05/11/13  Yes Historical Provider, MD  Ibuprofen (ADVIL) 200 MG CAPS Take 2 capsules by mouth daily as needed (pain).   Yes Historical Provider, MD  Pseudoeph-Doxylamine-DM-APAP (NYQUIL PO) Take 30 mLs by mouth daily as needed (cough).   Yes Historical Provider, MD    ALLERGIES:  Allergies  Allergen Reactions  . Sulfur Nausea And Vomiting    SOCIAL HISTORY:  History  Substance Use Topics  . Smoking status: Current Every Day Smoker -- 1.00 packs/day  . Smokeless tobacco: Not on file  . Alcohol Use: Yes     Comment: very little    FAMILY HISTORY: Family History  Problem Relation Age of Onset  . Cancer Mother     breast  . Cancer Maternal Uncle     colon    EXAM: BP 104/69  Pulse 71  Temp(Src) 97.6 F (36.4 C) (Oral)  Resp 20  Ht 5\' 4"  (1.626 m)  Wt 93 lb (42.185 kg)  BMI 15.96 kg/m2  SpO2 98% CONSTITUTIONAL: Alert and oriented and responds appropriately to questions. Well-appearing; well-nourished, thin, appears older than stated age HEAD: Normocephalic EYES: Conjunctivae clear, PERRL ENT: normal nose; no rhinorrhea; moist mucous membranes; pharynx without lesions noted NECK: Supple, no meningismus, no LAD  CARD: RRR; S1 and S2 appreciated; no murmurs, no clicks, no rubs, no gallops RESP: Normal chest excursion without splinting or tachypnea; patient has diminished breath sounds at her bases bilaterally without wheezing, rhonchi or rales  ABD/GI: Normal bowel sounds; non-distended; soft, non-tender, no rebound, no guarding BACK:  The back appears normal and is non-tender to palpation, there is no CVA tenderness EXT: Normal ROM in all joints; non-tender to palpation; no edema; normal capillary refill; no cyanosis    SKIN: Normal color for age and race; warm NEURO: Moves all extremities equally PSYCH: The patient's mood and manner are appropriate. Grooming and personal hygiene are  appropriate.  MEDICAL DECISION MAKING: Patient here with new oxygen requirement and symptoms concerning for pneumonia. She has a leukocytosis of 14.8. Will obtain chest x-ray and give antibiotic for community acquired pneumonia. Troponin negative. EKG shows no ischemic changes.  ED PROGRESS: Chest x-ray shows interstitial mild pulmonary edema and a patchy left lower lobe opacity that I feel is consistent with pneumonia. Patient feels slightly improved after breathing treatments. She is better aeration at her bases but is still requiring oxygen. Discussed with hospitalist, Dr. Ardyth Harps, for admission.     Layla Maw Fransheska Willingham, DO 05/15/13 1643

## 2013-05-15 NOTE — Progress Notes (Signed)
Pt does not want to sign up for My Chart at present time. Briscoe Burns BSN, RN-BC Admissions RN  05/15/2013 4:47 PM

## 2013-05-15 NOTE — Progress Notes (Signed)
Utilization Review completed.  Sabriyah Wilcher RN CM  

## 2013-05-15 NOTE — Progress Notes (Signed)
Pt c/o insomnia midlevel called awaiting call back.   

## 2013-05-15 NOTE — ED Notes (Signed)
Pt sent from her PCP with low O2 sats. Pt O2 86% RA, with 2L, pt 100%. Pt states she was dx with "lung infection" Monday, but does not know exactly what. Pt was placed on abx at that time. Pt reports dry, unproductive cough, nasal congestion but denies N/V, fever, diarrhea, CP. Pt is A&O and in NAD, husband at bedside.

## 2013-05-15 NOTE — H&P (Signed)
[facility]          History and Physical    PCP:   Kaleen Mask, MD   Chief Complaint:  Shortness of breath, chest wall pain, general malaise  HPI: Patient is a 62 year old white woman with past medical history only significant for tobacco abuse. About 7 days ago she started having cough productive of a greenish sputum as well as subjective fever and chills and general malaise. She went to see her PCP 5 days ago who gave her a course of doxycycline of which she has been taking for 4 days. She went back to see her PCP today as she feels that she has not been improving. While there her sats are noted to be mid 80% on room air and she was sent to the hospital for further evaluation. In the emergency department it is confirmed that her O2 saturations are indeed 86% on room air and a chest x-ray confirms what appears to be pneumonia. We have been asked to admit her for further evaluation and management.  Allergies:   Allergies  Allergen Reactions  . Sulfur Nausea And Vomiting      Past Medical History  Diagnosis Date  . Wears glasses   . Hernia   . Osteoporosis   . Complication of anesthesia     bp dropped with d/c  . PONV (postoperative nausea and vomiting)   . Contact lens/glasses fitting   . No pertinent past medical history     Past Surgical History  Procedure Laterality Date  . Cystectomy  1995  . Dilation and curettage of uterus  1990  . Cyst excision      rt groin  . Tonsillectomy    . Inguinal hernia repair  06/25/2012    Procedure: HERNIA REPAIR INGUINAL ADULT;  Surgeon: Emelia Loron, MD;  Location: [facility];  Service: General;  Laterality: Left;  . Insertion of mesh  06/25/2012    Procedure: INSERTION OF MESH;  Surgeon: Emelia Loron, MD;  Location: [facility];  Service: General;  Laterality: Left;    Prior to Admission medications   Medication Sig Start Date End Date Taking? Authorizing Provider   dextromethorphan-guaiFENesin (MUCINEX DM) 30-600 MG per 12 hr tablet Take 1 tablet by mouth every 12 (twelve) hours.   Yes Historical Provider, MD  doxycycline (ADOXA) 100 MG tablet Take 100 mg by mouth 2 (two) times daily. 05/11/13  Yes Historical Provider, MD  Ibuprofen (ADVIL) 200 MG CAPS Take 2 capsules by mouth daily as needed (pain).   Yes Historical Provider, MD  Pseudoeph-Doxylamine-DM-APAP (NYQUIL PO) Take 30 mLs by mouth daily as needed (cough).   Yes Historical Provider, MD    Social History:  reports that she has been smoking.  She has never used smokeless tobacco. She reports that she drinks alcohol. She reports that she does not use illicit drugs.  Family History  Problem Relation Age of Onset  . Cancer Mother     breast  . Cancer Maternal Uncle     colon    Review of Systems:  Constitutional: Positive for fever, chills. HEENT: Denies photophobia, eye pain, redness, hearing loss, ear pain, congestion, sore throat, rhinorrhea, sneezing, mouth sores, trouble swallowing, neck pain, neck stiffness and tinnitus.   Respiratory: Denies chest tightness,  and wheezing.   Cardiovascular: Denies chest pain, palpitations and leg swelling.  Gastrointestinal: Denies nausea, vomiting, abdominal pain, diarrhea, constipation, blood in stool and abdominal distention.  Genitourinary: Denies dysuria, urgency, frequency,  hematuria, flank pain and difficulty urinating.  Endocrine: Denies: hot or cold intolerance, sweats, changes in hair or nails, polyuria, polydipsia. Musculoskeletal: Denies myalgias, back pain, joint swelling, arthralgias and gait problem.  Skin: Denies pallor, rash and wound.  Neurological: Denies dizziness, seizures, syncope, weakness, light-headedness, numbness and headaches.  Hematological: Denies adenopathy. Easy bruising, personal or family bleeding history  Psychiatric/Behavioral: Denies suicidal ideation, mood changes, confusion, nervousness, sleep disturbance and  agitation   Physical Exam: Blood pressure 123/61, pulse 67, temperature 97.6 F (36.4 C), temperature source Oral, resp. rate 22, height 5\' 4"  (1.626 m), weight 42.185 kg (93 lb), SpO2 99.00%. General: Alert, awake, oriented x3. HEENT: Normocephalic, atraumatic, pupils equal round and reactive to light, extraocular movements intact, dry mucous membranes, poor dentition. Neck: Supple, no JVD, no lymphadenopathy, no bruits, no goiter. Cardiovascular: Regular rate and rhythm, no murmurs, rubs or gallops. Lungs: No wheezing, mild by basilar rhonchi. Abdomen: Soft, nontender, nondistended, positive bowel sounds, no masses or organomegaly noted. Extremities: No clubbing, cyanosis or edema, positive pedal pulses. Neurologic: Grossly intact and nonfocal although I have not ambulated her.  Labs on Admission:  Results for orders placed during the hospital encounter of 05/15/13 (from the past 48 hour(s))  CBC     Status: Abnormal   Collection Time    05/15/13  2:48 PM      Result Value Range   WBC 14.8 (*) 4.0 - 10.5 K/uL   RBC 3.89  3.87 - 5.11 MIL/uL   Hemoglobin 12.4  12.0 - 15.0 g/dL   HCT 81.1 (*) 91.4 - 78.2 %   MCV 92.0  78.0 - 100.0 fL   MCH 31.9  26.0 - 34.0 pg   MCHC 34.6  30.0 - 36.0 g/dL   RDW 95.6  21.3 - 08.6 %   Platelets 376  150 - 400 K/uL  COMPREHENSIVE METABOLIC PANEL     Status: Abnormal   Collection Time    05/15/13  2:48 PM      Result Value Range   Sodium 137  135 - 145 mEq/L   Potassium 3.3 (*) 3.5 - 5.1 mEq/L   Chloride 98  96 - 112 mEq/L   CO2 26  19 - 32 mEq/L   Glucose, Bld 96  70 - 99 mg/dL   BUN 20  6 - 23 mg/dL   Creatinine, Ser 5.78  0.50 - 1.10 mg/dL   Calcium 9.4  8.4 - 46.9 mg/dL   Total Protein 6.5  6.0 - 8.3 g/dL   Albumin 2.6 (*) 3.5 - 5.2 g/dL   AST 12  0 - 37 U/L   ALT 14  0 - 35 U/L   Alkaline Phosphatase 85  39 - 117 U/L   Total Bilirubin 0.6  0.3 - 1.2 mg/dL   GFR calc non Af Amer >90  >90 mL/min   GFR calc Af Amer >90  >90 mL/min    Comment: (NOTE)     The eGFR has been calculated using the CKD EPI equation.     This calculation has not been validated in all clinical situations.     eGFR's persistently <90 mL/min signify possible Chronic Kidney     Disease.  POCT I-STAT TROPONIN I     Status: None   Collection Time    05/15/13  3:24 PM      Result Value Range   Troponin i, poc 0.03  0.00 - 0.08 ng/mL   Comment 3  Comment: Due to the release kinetics of cTnI,     a negative result within the first hours     of the onset of symptoms does not rule out     myocardial infarction with certainty.     If myocardial infarction is still suspected,     repeat the test at appropriate intervals.    Radiological Exams on Admission: Dg Chest 2 View  05/15/2013   CLINICAL DATA:  Smoker with cough, congestion and shortness of breath.  EXAM: CHEST  2 VIEW  COMPARISON:  None.  FINDINGS: The heart size and mediastinal contours are normal. There is diffuse interstitial prominence at both lung bases suspicious for interstitial edema. There are small bilateral pleural effusions. Asymmetric left basilar opacity likely represents atelectasis. The bones appear diffusely demineralized. No acute osseous findings are seen.  IMPRESSION: Suspected interstitial pulmonary edema with small bilateral pleural effusions. There is patchy left lower lobe airspace disease which may reflect atelectasis or pneumonia.   Electronically Signed   By: Roxy Horseman M.D.   On: 05/15/2013 16:06    Assessment/Plan Principal Problem:   Acute respiratory failure Active Problems:   CAP (community acquired pneumonia)   Tobacco abuse   Acute hypoxemic respiratory failure -Likely related to pneumonia evidenced on chest x-ray. -Chest x-ray also shows what appears to be some interstitial edema and small pleural effusions. -Will order a 2-D echo to further evaluate for potential congestive heart failure.  Community acquired pneumonia -Has been started on  Rocephin and azithromycin. -Check blood and sputum cultures. -Check urine strep pneumo and Legionella antigens.  Tobacco abuse -Extensively counseled on cessation. -Nicotine patch while in the hospital.  DVT prophylaxis -Lovenox.  CODE STATUS -Full code  Time Spent on Admission: 70 minutes  HERNANDEZ ACOSTA,ESTELA [facility] Pager: [phone] 05/15/2013, 5:36 PM

## 2013-05-16 DIAGNOSIS — I369 Nonrheumatic tricuspid valve disorder, unspecified: Secondary | ICD-10-CM

## 2013-05-16 LAB — BASIC METABOLIC PANEL
Creatinine, Ser: 0.64 mg/dL (ref 0.50–1.10)
Glucose, Bld: 94 mg/dL (ref 70–99)
Potassium: 3.1 mEq/L — ABNORMAL LOW (ref 3.5–5.1)
Sodium: 136 mEq/L (ref 135–145)

## 2013-05-16 LAB — INFLUENZA PANEL BY PCR (TYPE A & B): H1N1 flu by pcr: NOT DETECTED

## 2013-05-16 LAB — CBC
HCT: 30.3 % — ABNORMAL LOW (ref 36.0–46.0)
MCHC: 34.3 g/dL (ref 30.0–36.0)
MCV: 92.1 fL (ref 78.0–100.0)
Platelets: 359 10*3/uL (ref 150–400)
RBC: 3.29 MIL/uL — ABNORMAL LOW (ref 3.87–5.11)
RDW: 13.7 % (ref 11.5–15.5)
WBC: 13.5 10*3/uL — ABNORMAL HIGH (ref 4.0–10.5)

## 2013-05-16 LAB — LEGIONELLA ANTIGEN, URINE

## 2013-05-16 LAB — STREP PNEUMONIAE URINARY ANTIGEN: Strep Pneumo Urinary Antigen: NEGATIVE

## 2013-05-16 MED ORDER — POTASSIUM CHLORIDE CRYS ER 20 MEQ PO TBCR
20.0000 meq | EXTENDED_RELEASE_TABLET | Freq: Two times a day (BID) | ORAL | Status: DC
  Administered 2013-05-16 – 2013-05-18 (×5): 20 meq via ORAL
  Filled 2013-05-16 (×6): qty 1

## 2013-05-16 NOTE — Progress Notes (Signed)
[facility] PROGRESS NOTE  Mindy Wilson FAO:[id number] DOB: 1950/07/26 DOA: 05/15/2013 PCP: Kaleen Mask, MD  Brief narrative: Mindy Wilson is an 62 y.o. female with a PMH of tobacco abuse who was admitted on 05/15/2013 with acute hypoxic respiratory failure secondary to community-acquired pneumonia.  Assessment/Plan: Principal Problem:   Acute hypoxic respiratory failure -Likely secondary to community-acquired pneumonia but also had edema on chest radiography. -Followup two-dimensional echocardiogram and continue empiric antibiotics for treatment of pneumonia Active Problems:   CAP (community acquired pneumonia) -Continue empiric Rocephin/azithromycin. -Followup blood and sputum cultures. -Strep pneumonia antigen negative, Legionella antigen pending. HIV nonreactive.   Tobacco abuse -Counseled. Continue nicotine patch.   Hypokalemia -We'll supplement.  Code Status: Full code. Family Communication: No family at bedside. Disposition Plan: Home when stable.   Medical Consultants:  None.  Other Consultants:  None.  Anti-infectives:  Rocephin 05/15/2013--->  Azithromycin 05/15/2013--->  HPI/Subjective: Mindy Wilson continues to feel poorly. Low-grade fevers. Weak. Occasional cough. Denies shortness of breath but says she couldn't tell if she was short of breath because she has been wearing oxygen.  Objective: Filed Vitals:   05/15/13 1500 05/15/13 1806 05/15/13 2009 05/16/13 0620  BP: 123/61 124/58 124/61 129/57  Pulse: 67 95 87 73  Temp:  98.5 F (36.9 C) 99.1 F (37.3 C) 98 F (36.7 C)  TempSrc:  Oral Oral   Resp: 22 20 20 20   Height:  5\' 4"  (1.626 m)    Weight:  42.185 kg (93 lb)    SpO2: 99% 98% 95% 96%    Intake/Output Summary (Last 24 hours) at 05/16/13 0820 Last data filed at 05/16/13 0400  Gross per 24 hour  Intake 921.67 ml  Output    550 ml  Net 371.67 ml    Exam: Gen:  NAD Cardiovascular:  RRR, No M/R/G Respiratory:  Lungs  diminished Gastrointestinal:  Abdomen soft, NT/ND, + BS Extremities:  No C/E/C  Data Reviewed: Basic Metabolic Panel:  Recent Labs Lab 05/15/13 1448 05/16/13 0421  NA 137 136  K 3.3* 3.1*  CL 98 100  CO2 26 26  GLUCOSE 96 94  BUN 20 16  CREATININE 0.65 0.64  CALCIUM 9.4 9.1   GFR Estimated Creatinine Clearance: 48.6 ml/min (by C-G formula based on Cr of 0.64). Liver Function Tests:  Recent Labs Lab 05/15/13 1448  AST 12  ALT 14  ALKPHOS 85  BILITOT 0.6  PROT 6.5  ALBUMIN 2.6*   CBC:  Recent Labs Lab 05/15/13 1448 05/16/13 0421  WBC 14.8* 13.5*  HGB 12.4 10.4*  HCT 35.8* 30.3*  MCV 92.0 92.1  PLT 376 359   Microbiology No results found for this or any previous visit (from the past 240 hour(s)).   Procedures and Diagnostic Studies: Dg Chest 2 View  05/15/2013   CLINICAL DATA:  Smoker with cough, congestion and shortness of breath.  EXAM: CHEST  2 VIEW  COMPARISON:  None.  FINDINGS: The heart size and mediastinal contours are normal. There is diffuse interstitial prominence at both lung bases suspicious for interstitial edema. There are small bilateral pleural effusions. Asymmetric left basilar opacity likely represents atelectasis. The bones appear diffusely demineralized. No acute osseous findings are seen.  IMPRESSION: Suspected interstitial pulmonary edema with small bilateral pleural effusions. There is patchy left lower lobe airspace disease which may reflect atelectasis or pneumonia.   Electronically Signed   By: Roxy Horseman M.D.   On: 05/15/2013 16:06    Scheduled Meds: . azithromycin  500 mg Oral Q24H  .  cefTRIAXone (ROCEPHIN)  IV  1 g Intravenous Q24H  . dextromethorphan-guaiFENesin  1 tablet Oral BID  . enoxaparin (LOVENOX) injection  30 mg Subcutaneous Q24H  . nicotine  21 mg Transdermal Daily   Continuous Infusions: . sodium chloride 50 mL/hr at 05/15/13 2010    Time spent: 35 minutes with > 50% of time discussing current diagnostic test  results, clinical impression and plan of care.   LOS: 1 day   RAMA,CHRISTINA  [facility] Pager [phone].   *Please note that the hospitalists switch teams on Wednesdays. Please call the flow manager at [phone] if you are having difficulty reaching the hospitalist taking care of this patient as she can update you and provide the most up-to-date pager number of provider caring for the patient. If 8PM-8AM, please contact night-coverage at [email or web address], password [email or web address]  05/16/2013, 8:20 AM    Information printed out, explained, and given to the patient:  In an effort to keep you and your family informed about your hospital stay, I am providing you with this information sheet. If you or your family have any questions, please do not hesitate to have the nursing staff page me to set up a meeting time.  Mindy Wilson 05/16/2013 1 (Number of days in the hospital)  Treatment team:  Dr. Hillery Aldo, Hospitalist (Internist)   Active Treatment Issues with Plan: Principal Problem:   Trouble breathing -Likely from pneumonia, but there was also fluid on your lungs on your x-ray, which could mean your heart is weak. -A two-dimensional echocardiogram was done to evaluate the strength of your heart muscle. -Continue antibiotics for treatment of pneumonia Active Problems:   Pneumonia -Continue current antibiotics: Rocephin/azithromycin. -Followup blood and sputum cultures (can take 3 days to get these back).   Smoker -We recommend that you stop smoking. Continue nicotine patch.   Low potassium -We have ordered a supplement. Significant Lab results: -Potassium 3.1 (low) -WBC (infection fighting cells): 13.5 (slightly high) Significant diagnostic study results: -Chest x-ray 05/15/2013: Fluid and swelling of the left lower lung, concerning for pneumonia Anticipated discharge date: Depends on progress but typically uncomplicated pneumonia results in a 2-3 day hospital  stay.

## 2013-05-16 NOTE — Progress Notes (Signed)
  Echocardiogram 2D Echocardiogram has been performed.  Roberts, Kamarah Bilotta M 05/16/2013, 11:00 AM 

## 2013-05-17 LAB — BASIC METABOLIC PANEL
Calcium: 8.7 mg/dL (ref 8.4–10.5)
Chloride: 98 mEq/L (ref 96–112)
Creatinine, Ser: 0.57 mg/dL (ref 0.50–1.10)
GFR calc Af Amer: >90 mL/min (ref >90)
GFR calc non Af Amer: >90 mL/min (ref >90)
Sodium: 133 mEq/L — ABNORMAL LOW (ref 135–145)

## 2013-05-17 LAB — CBC
HCT: 32.1 % — ABNORMAL LOW (ref 36.0–46.0)
MCH: 32.2 pg (ref 26.0–34.0)
MCHC: 34.6 g/dL (ref 30.0–36.0)
MCV: 93 fL (ref 78.0–100.0)
Platelets: 426 10*3/uL — ABNORMAL HIGH (ref 150–400)
RBC: 3.45 MIL/uL — ABNORMAL LOW (ref 3.87–5.11)
RDW: 13.6 % (ref 11.5–15.5)

## 2013-05-17 MED ORDER — LORAZEPAM 0.5 MG PO TABS
0.5000 mg | ORAL_TABLET | Freq: Every day | ORAL | Status: DC
  Administered 2013-05-17: 0.5 mg via ORAL
  Filled 2013-05-17: qty 1

## 2013-05-17 MED ORDER — GUAIFENESIN ER 600 MG PO TB12
600.0000 mg | ORAL_TABLET | Freq: Two times a day (BID) | ORAL | Status: DC
  Administered 2013-05-17 – 2013-05-18 (×3): 600 mg via ORAL
  Filled 2013-05-17 (×4): qty 1

## 2013-05-17 MED ORDER — ENSURE COMPLETE PO LIQD
237.0000 mL | Freq: Three times a day (TID) | ORAL | Status: DC
  Administered 2013-05-17 – 2013-05-18 (×2): 237 mL via ORAL

## 2013-05-17 MED ORDER — GUAIFENESIN-DM 100-10 MG/5ML PO SYRP: 5.0000 mL | ORAL_SOLUTION | ORAL | Status: DC | PRN

## 2013-05-17 NOTE — Progress Notes (Signed)
[facility] PROGRESS NOTE  Vieva Brummitt ZOX:[id number] DOB: 29-Nov-1950 DOA: 05/15/2013 PCP: Kaleen Mask, MD  Brief narrative: Mindy Wilson is an 62 y.o. female with a PMH of tobacco abuse who was admitted on 05/15/2013 with acute hypoxic respiratory failure secondary to community-acquired pneumonia.  Assessment/Plan: Principal Problem:   Acute hypoxic respiratory failure -Likely secondary to community-acquired pneumonia but also had edema on chest radiography. -WBC declining. -EF 60-65%, no evidence of CHF. Continue empiric antibiotics for treatment of pneumonia Active Problems:   CAP (community acquired pneumonia) -Continue empiric Rocephin/azithromycin. -Followup blood and sputum cultures. -Strep pneumonia antigen negative, Legionella antigen pending. HIV nonreactive.  Influenza A/B negative.   Tobacco abuse -Counseled. Continue nicotine patch.   Hypokalemia -Corrected.  Code Status: Full code. Family Communication: No family at bedside. Disposition Plan: Home when stable.   Medical Consultants:  None.  Other Consultants:  None.  Anti-infectives:  Rocephin 05/15/2013--->  Azithromycin 05/15/2013--->  HPI/Subjective: Mindy Wilson continues to feel poorly. No shortness of breath but has a moist cough and is very weak. Didn't sleep well last night. Says she was feeling better last evening, but once again, feels poorly.  Objective: Filed Vitals:   05/16/13 0620 05/16/13 1432 05/16/13 2254 05/17/13 0446  BP: 129/57 120/57 135/59 111/54  Pulse: 73 79 73 70  Temp: 98 F (36.7 C) 98.4 F (36.9 C) 99.3 F (37.4 C) 99.2 F (37.3 C)  TempSrc:  Oral Oral Oral  Resp: 20 18 18 18   Height:      Weight:      SpO2: 96% 97% 97% 98%    Intake/Output Summary (Last 24 hours) at 05/17/13 0749 Last data filed at 05/17/13 0448  Gross per 24 hour  Intake   1080 ml  Output   1001 ml  Net     79 ml    Exam: Gen:  NAD Cardiovascular:  RRR, No  M/R/G Respiratory:  Lungs diminished with some rhonchi/rales left base Gastrointestinal:  Abdomen soft, NT/ND, + BS Extremities:  No C/E/C  Data Reviewed: Basic Metabolic Panel:  Recent Labs Lab 05/15/13 1448 05/16/13 0421 05/17/13 0420  NA 137 136 133*  K 3.3* 3.1* 3.5  CL 98 100 98  CO2 26 26 26   GLUCOSE 96 94 97  BUN 20 16 13   CREATININE 0.65 0.64 0.57  CALCIUM 9.4 9.1 8.7   GFR Estimated Creatinine Clearance: 48.6 ml/min (by C-G formula based on Cr of 0.57). Liver Function Tests:  Recent Labs Lab 05/15/13 1448  AST 12  ALT 14  ALKPHOS 85  BILITOT 0.6  PROT 6.5  ALBUMIN 2.6*   CBC:  Recent Labs Lab 05/15/13 1448 05/16/13 0421 05/17/13 0420  WBC 14.8* 13.5* 10.7*  HGB 12.4 10.4* 11.1*  HCT 35.8* 30.3* 32.1*  MCV 92.0 92.1 93.0  PLT 376 359 426*   Microbiology Recent Results (from the past 240 hour(s))  CULTURE, BLOOD (ROUTINE X 2)     Status: None   Collection Time    05/15/13  3:25 PM      Result Value Range Status   Specimen Description BLOOD LEFT ANTECUBITAL   Final   Special Requests NONE BOTTLES DRAWN AEROBIC AND ANAEROBIC 4CC   Final   Culture  Setup Time     Final   Value: 05/15/2013 20:15     Performed at [facility]   Culture     Final   Value:        BLOOD CULTURE RECEIVED NO GROWTH TO DATE CULTURE  WILL BE HELD FOR 5 DAYS BEFORE ISSUING A FINAL NEGATIVE REPORT     Performed at [facility]   Report Status PENDING   Incomplete  CULTURE, BLOOD (ROUTINE X 2)     Status: None   Collection Time    05/15/13  3:33 PM      Result Value Range Status   Specimen Description BLOOD LFA   Final   Special Requests NONE BOTTLES DRAWN AEROBIC AND ANAEROBIC 5CC   Final   Culture  Setup Time     Final   Value: 05/15/2013 20:15     Performed at [facility]   Culture     Final   Value:        BLOOD CULTURE RECEIVED NO GROWTH TO DATE CULTURE WILL BE HELD FOR 5 DAYS BEFORE ISSUING A FINAL NEGATIVE REPORT     Performed at  [facility]   Report Status PENDING   Incomplete     Procedures and Diagnostic Studies: Dg Chest 2 View  05/15/2013   CLINICAL DATA:  Smoker with cough, congestion and shortness of breath.  EXAM: CHEST  2 VIEW  COMPARISON:  None.  FINDINGS: The heart size and mediastinal contours are normal. There is diffuse interstitial prominence at both lung bases suspicious for interstitial edema. There are small bilateral pleural effusions. Asymmetric left basilar opacity likely represents atelectasis. The bones appear diffusely demineralized. No acute osseous findings are seen.  IMPRESSION: Suspected interstitial pulmonary edema with small bilateral pleural effusions. There is patchy left lower lobe airspace disease which may reflect atelectasis or pneumonia.   Electronically Signed   By: Roxy Horseman M.D.   On: 05/15/2013 16:06    Scheduled Meds: . azithromycin  500 mg Oral Q24H  . cefTRIAXone (ROCEPHIN)  IV  1 g Intravenous Q24H  . dextromethorphan-guaiFENesin  1 tablet Oral BID  . enoxaparin (LOVENOX) injection  30 mg Subcutaneous Q24H  . nicotine  21 mg Transdermal Daily  . potassium chloride  20 mEq Oral BID   Continuous Infusions: . sodium chloride 50 mL/hr at 05/16/13 1700    Time spent: 25 minutes.   LOS: 2 days   RAMA,CHRISTINA  [facility] Pager [phone].   *Please note that the hospitalists switch teams on Wednesdays. Please call the flow manager at [phone] if you are having difficulty reaching the hospitalist taking care of this patient as she can update you and provide the most up-to-date pager number of provider caring for the patient. If 8PM-8AM, please contact night-coverage at [email or web address], password [facility]  05/17/2013, 7:49 AM    Information printed out, explained, and given to the patient:  In an effort to keep you and your family informed about your hospital stay, I am providing you with this information sheet. If you or your family have any questions,  please do not hesitate to have the nursing staff page me to set up a meeting time.  Benicia Bergevin 05/17/2013 2 (Number of days in the hospital)  Treatment team:  Dr. Hillery Aldo, Hospitalist (Internist)   Active Treatment Issues with Plan: Principal Problem:   Pneumonia with Trouble breathing -Seems to be from pneumonia. No evidence that your heart muscle is weak. -A two-dimensional echocardiogram was done to evaluate the strength of your heart muscle, and it was normal. -Continue antibiotics for treatment of pneumonia. -Blood cultures have been negative to date.   Active problems:  Smoker -We recommend that you stop smoking. Continue nicotine patch.   Low potassium -  Your potassium is normal today. Significant Lab results: -Potassium 3.5 (normal) -WBC (infection fighting cells): 10.7 (improved)  Significant diagnostic study results: -Chest x-ray 05/15/2013: Fluid and swelling of the left lower lung, concerning for pneumonia -2-D echocardiogram 05/16/2013: Ejection fraction 60-65% (normal)  Anticipated discharge date: 1-2 days.

## 2013-05-17 NOTE — Progress Notes (Signed)
INITIAL NUTRITION ASSESSMENT  DOCUMENTATION CODES Per approved criteria  -Severe malnutrition in the context of chronic illness   INTERVENTION: 1. Ensure Complete TID, 8 oz provides 350 kcal, 13 g protein.  2. Encourage adequate intake of meals and supplements.   NUTRITION DIAGNOSIS: Inadequate oral intake related to nausea as evidenced by 0% intake.   Goal: Patient will meet >/=90% of estimated nutrition needs  Monitor:  PO intake, weight, labs  Reason for Assessment: Malnutrition screening tool  62 y.o. female  Admitting Dx: Acute respiratory failure  ASSESSMENT: Patient admitted with acute respiratory failure secondary to pneumonia. She reports that her appetite has been poor for about a week prior to admission. She ate better yesterday, but has not been able to eat today due to nausea and decreased appetite. She denies weight loss, but per chart review, she has lost about 5% of her UBW in 11 months.   Nutrition Focused Physical Exam:  Subcutaneous Fat:  Orbital Region: moderate wasting Upper Arm Region: severe wasting Thoracic and Lumbar Region: severe wasting  Muscle:  Temple Region: moderate wasting Clavicle Bone Region: severe wasting Clavicle and Acromion Bone Region: severe wasting Scapular Bone Region: severe wasting Dorsal Hand: severe wasting Patellar Region: severe wasting Anterior Thigh Region: severe wasting Posterior Calf Region: severe wasting  Edema: WNL  Patient meets the criteria for severe MALNUTRITION in the context of chronic illness with severe fat and muscle tissue wasting  Height: Ht Readings from Last 1 Encounters:  05/15/13 5\' 4"  (1.626 m)    Weight: Wt Readings from Last 1 Encounters:  05/15/13 93 lb (42.185 kg)    Ideal Body Weight: 120 pounds  % Ideal Body Weight: 78%  Wt Readings from Last 10 Encounters:  05/15/13 93 lb (42.185 kg)  06/25/12 98 lb 4 oz (44.566 kg)  06/25/12 98 lb 4 oz (44.566 kg)  05/06/12 95 lb 9.6 oz  (43.364 kg)    Usual Body Weight: 98 pounds  % Usual Body Weight: 95%  BMI:  Body mass index is 15.96 kg/(m^2). Patient is underweight.  Estimated Nutritional Needs: Kcal: 1350-1450 kcal Protein: 60-70 g Fluid: 1.5 L/day  Skin: Intact  Diet Order: Cardiac  EDUCATION NEEDS: -No education needs identified at this time   Intake/Output Summary (Last 24 hours) at 05/17/13 1425 Last data filed at 05/17/13 1353  Gross per 24 hour  Intake   1840 ml  Output    801 ml  Net   1039 ml    Last BM: 10/25   Labs:   Recent Labs Lab 05/15/13 1448 05/16/13 0421 05/17/13 0420  NA 137 136 133*  K 3.3* 3.1* 3.5  CL 98 100 98  CO2 26 26 26   BUN 20 16 13   CREATININE 0.65 0.64 0.57  CALCIUM 9.4 9.1 8.7  GLUCOSE 96 94 97    CBG (last 3)  No results found for this basename: GLUCAP,  in the last 72 hours  Scheduled Meds: . azithromycin  500 mg Oral Q24H  . cefTRIAXone (ROCEPHIN)  IV  1 g Intravenous Q24H  . dextromethorphan-guaiFENesin  1 tablet Oral BID  . enoxaparin (LOVENOX) injection  30 mg Subcutaneous Q24H  . guaiFENesin  600 mg Oral BID  . LORazepam  0.5 mg Oral QHS  . nicotine  21 mg Transdermal Daily  . potassium chloride  20 mEq Oral BID    Continuous Infusions: . sodium chloride 50 mL/hr at 05/16/13 1700    Past Medical History  Diagnosis Date  .  Wears glasses   . Hernia   . Osteoporosis   . Complication of anesthesia     bp dropped with d/c  . PONV (postoperative nausea and vomiting)   . Contact lens/glasses fitting   . No pertinent past medical history     Past Surgical History  Procedure Laterality Date  . Cystectomy  1995  . Dilation and curettage of uterus  1990  . Cyst excision      rt groin  . Tonsillectomy    . Inguinal hernia repair  06/25/2012    Procedure: HERNIA REPAIR INGUINAL ADULT;  Surgeon: Emelia Loron, MD;  Location: [facility];  Service: General;  Laterality: Left;  . Insertion of mesh  06/25/2012     Procedure: INSERTION OF MESH;  Surgeon: Emelia Loron, MD;  Location: [facility];  Service: General;  Laterality: Left;    Linnell Fulling, RD, LDN Pager #: [phone] After-Hours Pager #: [phone]

## 2013-05-18 DIAGNOSIS — E43 Unspecified severe protein-calorie malnutrition: Secondary | ICD-10-CM | POA: Insufficient documentation

## 2013-05-18 LAB — CBC
HCT: 31.3 % — ABNORMAL LOW (ref 36.0–46.0)
Hemoglobin: 10.9 g/dL — ABNORMAL LOW (ref 12.0–15.0)
MCH: 32.8 pg (ref 26.0–34.0)
MCHC: 34.8 g/dL (ref 30.0–36.0)
Platelets: 467 10*3/uL — ABNORMAL HIGH (ref 150–400)
RBC: 3.32 MIL/uL — ABNORMAL LOW (ref 3.87–5.11)
WBC: 10.2 10*3/uL (ref 4.0–10.5)

## 2013-05-18 LAB — BASIC METABOLIC PANEL
CO2: 28 mEq/L (ref 19–32)
Calcium: 8.8 mg/dL (ref 8.4–10.5)
Chloride: 96 mEq/L (ref 96–112)
GFR calc Af Amer: >90 mL/min (ref >90)
GFR calc non Af Amer: >90 mL/min (ref >90)
Glucose, Bld: 106 mg/dL — ABNORMAL HIGH (ref 70–99)
Potassium: 3.7 mEq/L (ref 3.5–5.1)
Sodium: 133 mEq/L — ABNORMAL LOW (ref 135–145)

## 2013-05-18 MED ORDER — LEVOFLOXACIN 750 MG PO TABS: 750.0000 mg | ORAL_TABLET | Freq: Every day | ORAL | Status: DC

## 2013-05-18 NOTE — Care Management Note (Signed)
MD order for home oxygen at 2L continously. [facility] DME rep [place] notified. No other needs identified.    Roxy Manns Dellanira Dillow,RN,MSN [phone]

## 2013-05-18 NOTE — Care Management Note (Addendum)
   CARE MANAGEMENT NOTE 05/18/2013  Patient:  KAMALEI, ROEDER   Account Number:  [id number]  Date Initiated:  05/18/2013  Documentation initiated by:  Shadara Lopez  Subjective/Objective Assessment:   62 yo female admitted with acute respiratory failure.     Action/Plan:   home when stable   Anticipated DC Date:  05/18/2013   Anticipated DC Plan:  HOME/SELF CARE      DC Planning Services  CM consult      Choice offered to / List presented to:  NA   DME arranged  [facility]      DME agency  [facility]     HH arranged  NA      HH agency  NA   Status of service:  Completed, signed off Medicare Important Message given?   (If response is "NO", the following Medicare IM given date fields will be blank) Date Medicare IM given:   Date Additional Medicare IM given:    Discharge Disposition:    Per UR Regulation:  Reviewed for med. necessity/level of care/duration of stay  If discussed at Long Length of Stay Meetings, dates discussed:    Comments:  Sharmon Leyden, RN Registered Nurse Signed CASE MANAGEMENT Care Management Note Service date: 05/18/2013 1:51 PM MD order for home oxygen at 2L continously. [facility] DME rep [place] notified. No other needs identified.PCP:  Kaleen Mask, MD

## 2013-05-18 NOTE — Progress Notes (Signed)
[facility] PROGRESS NOTE  Mindy Wilson ZOX:[id number] DOB: 06-25-1951 DOA: 05/15/2013 PCP: Kaleen Mask, MD  Brief narrative: Mindy Wilson is an 62 y.o. female with a PMH of tobacco abuse who was admitted on 05/15/2013 with acute hypoxic respiratory failure secondary to community-acquired pneumonia.  Assessment/Plan: Principal Problem:   Acute hypoxic respiratory failure -Likely secondary to community-acquired pneumonia but also had edema on chest radiography. -WBC declining. -EF 60-65%, no evidence of CHF. Continue empiric antibiotics for treatment of pneumonia Active Problems:   CAP (community acquired pneumonia) -Continue empiric Rocephin/azithromycin. -Followup blood and sputum cultures. -Strep pneumonia antigen negative, Legionella antigen pending. HIV nonreactive.  Influenza A/B negative.   Severe malnutrition in the context of chronic illness  -Seen by dietitian 05/17/2013. Continue Ensure supplements.   Tobacco abuse -Counseled. Continue nicotine patch.   Hypokalemia -Corrected.  Code Status: Full code. Family Communication: No family at bedside. Disposition Plan: Home when stable.   Medical Consultants:  None.  Other Consultants:  None.  Anti-infectives:  Rocephin 05/15/2013--->  Azithromycin 05/15/2013--->  HPI/Subjective: Mindy Wilson feels better today. Has more energy. Still with a moist cough. Still wearing oxygen. Oxygen saturations 96-98% on 2 L.  Objective: Filed Vitals:   05/17/13 0446 05/17/13 1457 05/17/13 2105 05/18/13 0450  BP: 111/54 125/59 122/59 113/58  Pulse: 70 75 69 66  Temp: 99.2 F (37.3 C) 100.5 F (38.1 C) 98.6 F (37 C) 98.5 F (36.9 C)  TempSrc: Oral Oral Oral Oral  Resp: 18 16 16 18   Height:      Weight:      SpO2: 98% 96% 96% 98%    Intake/Output Summary (Last 24 hours) at 05/18/13 0739 Last data filed at 05/18/13 0500  Gross per 24 hour  Intake   1000 ml  Output   2700 ml  Net  -1700 ml     Exam: Gen:  NAD Cardiovascular:  RRR, No M/R/G Respiratory:  Lungs diminished with some rhonchi/rales left base Gastrointestinal:  Abdomen soft, NT/ND, + BS Extremities:  No C/E/C  Data Reviewed: Basic Metabolic Panel:  Recent Labs Lab 05/15/13 1448 05/16/13 0421 05/17/13 0420 05/18/13 0445  NA 137 136 133* 133*  K 3.3* 3.1* 3.5 3.7  CL 98 100 98 96  CO2 26 26 26 28   GLUCOSE 96 94 97 106*  BUN 20 16 13 8   CREATININE 0.65 0.64 0.57 0.49*  CALCIUM 9.4 9.1 8.7 8.8   GFR Estimated Creatinine Clearance: 48.6 ml/min (by C-G formula based on Cr of 0.49). Liver Function Tests:  Recent Labs Lab 05/15/13 1448  AST 12  ALT 14  ALKPHOS 85  BILITOT 0.6  PROT 6.5  ALBUMIN 2.6*   CBC:  Recent Labs Lab 05/15/13 1448 05/16/13 0421 05/17/13 0420 05/18/13 0445  WBC 14.8* 13.5* 10.7* 10.2  HGB 12.4 10.4* 11.1* 10.9*  HCT 35.8* 30.3* 32.1* 31.3*  MCV 92.0 92.1 93.0 94.3  PLT 376 359 426* 467*   Microbiology Recent Results (from the past 240 hour(s))  CULTURE, BLOOD (ROUTINE X 2)     Status: None   Collection Time    05/15/13  3:25 PM      Result Value Range Status   Specimen Description BLOOD LEFT ANTECUBITAL   Final   Special Requests NONE BOTTLES DRAWN AEROBIC AND ANAEROBIC 4CC   Final   Culture  Setup Time     Final   Value: 05/15/2013 20:15     Performed at [facility]   Culture     Final  Value:        BLOOD CULTURE RECEIVED NO GROWTH TO DATE CULTURE WILL BE HELD FOR 5 DAYS BEFORE ISSUING A FINAL NEGATIVE REPORT     Performed at [facility]   Report Status PENDING   Incomplete  CULTURE, BLOOD (ROUTINE X 2)     Status: None   Collection Time    05/15/13  3:33 PM      Result Value Range Status   Specimen Description BLOOD LFA   Final   Special Requests NONE BOTTLES DRAWN AEROBIC AND ANAEROBIC 5CC   Final   Culture  Setup Time     Final   Value: 05/15/2013 20:15     Performed at [facility]   Culture     Final   Value:         BLOOD CULTURE RECEIVED NO GROWTH TO DATE CULTURE WILL BE HELD FOR 5 DAYS BEFORE ISSUING A FINAL NEGATIVE REPORT     Performed at [facility]   Report Status PENDING   Incomplete     Procedures and Diagnostic Studies: Dg Chest 2 View  05/15/2013   CLINICAL DATA:  Smoker with cough, congestion and shortness of breath.  EXAM: CHEST  2 VIEW  COMPARISON:  None.  FINDINGS: The heart size and mediastinal contours are normal. There is diffuse interstitial prominence at both lung bases suspicious for interstitial edema. There are small bilateral pleural effusions. Asymmetric left basilar opacity likely represents atelectasis. The bones appear diffusely demineralized. No acute osseous findings are seen.  IMPRESSION: Suspected interstitial pulmonary edema with small bilateral pleural effusions. There is patchy left lower lobe airspace disease which may reflect atelectasis or pneumonia.   Electronically Signed   By: Roxy Horseman M.D.   On: 05/15/2013 16:06    Scheduled Meds: . azithromycin  500 mg Oral Q24H  . cefTRIAXone (ROCEPHIN)  IV  1 g Intravenous Q24H  . dextromethorphan-guaiFENesin  1 tablet Oral BID  . enoxaparin (LOVENOX) injection  30 mg Subcutaneous Q24H  . feeding supplement (ENSURE COMPLETE)  237 mL Oral TID BM  . guaiFENesin  600 mg Oral BID  . LORazepam  0.5 mg Oral QHS  . nicotine  21 mg Transdermal Daily  . potassium chloride  20 mEq Oral BID   Continuous Infusions: . sodium chloride 50 mL/hr at 05/16/13 1700    Time spent: 25 minutes.   LOS: 3 days   Mindy Wilson  [facility] Pager [phone].   *Please note that the hospitalists switch teams on Wednesdays. Please call the flow manager at [phone] if you are having difficulty reaching the hospitalist taking care of this patient as she can update you and provide the most up-to-date pager number of provider caring for the patient. If 8PM-8AM, please contact night-coverage at [email or web address], password  [facility]  05/18/2013, 7:39 AM    Information printed out, explained, and given to the patient:  In an effort to keep you and your family informed about your hospital stay, I am providing you with this information sheet. If you or your family have any questions, please do not hesitate to have the nursing staff page me to set up a meeting time.  Mindy Wilson 05/18/2013 3 (Number of days in the hospital)  Treatment team:  Dr. Hillery Aldo, Hospitalist (Internist)  Active Treatment Issues with Plan: Principal Problem:   Pneumonia with Trouble breathing -Seems to be from pneumonia. No evidence that your heart muscle is weak. -A two-dimensional echocardiogram was done to  evaluate the strength of your heart muscle, and it was normal. -Continue antibiotics for treatment of pneumonia. -Blood cultures have been negative to date.   Active problems:  Smoker -We recommend that you stop smoking. Continue nicotine patch.   Low potassium -Your potassium is normal today. Significant Lab results: -Potassium 3.5 (normal) -WBC (infection fighting cells): 10.7 (improved)  Significant diagnostic study results: -Chest x-ray 05/15/2013: Fluid and swelling of the left lower lung, concerning for pneumonia -2-D echocardiogram 05/16/2013: Ejection fraction 60-65% (normal)  Anticipated discharge date: Today or tomorrow depending on how you feel.

## 2013-05-18 NOTE — Discharge Summary (Signed)
Physician Discharge Summary  Mindy Wilson ZOX:[id number] DOB: [birth year] DOA: 05/15/2013  PCP: Kaleen Mask, MD  Admit date: 05/15/2013 Discharge date: 05/18/2013  Recommendations for Outpatient Follow-up:  1. Recommend followup in one week to ensure resolution of pneumonia. Recommend followup chest x-ray for further imaging once pneumonia has resolved to exclude underlying malignancy. Followup final blood cultures, which were negative at the time of discharge. 2. Recommend further outpatient evaluation of malnourishment. 3. Due to ongoing desaturation with activity, home oxygen set up.  Discharge Diagnoses:  Principal Problem:    Acute hypoxic respiratory failure Active Problems:    CAP (community acquired pneumonia)    Tobacco abuse    Protein-calorie malnutrition, severe    Hypokalemia  Discharge Condition: Improved.  Diet recommendation: Regular.  History of present illness:  Mindy Wilson is an 62 y.o. female with a PMH of tobacco abuse who was admitted on 05/15/2013 with acute hypoxic respiratory failure secondary to community-acquired pneumonia.  Hospital Course by problem:  Principal Problem:  Acute hypoxic respiratory failure  -Likely secondary to community-acquired pneumonia but also had edema on chest radiography.  -WBC normalized with empiric antibiotics.  -EF 60-65%, no evidence of CHF. Continue empiric antibiotics for treatment of pneumonia.  -Home oxygen set up. Active Problems:  CAP (community acquired pneumonia)  -Strep pneumonia antigen negative, Legionella antigen negative. HIV nonreactive. Influenza A/B negative.  -Treated with empiric Rocephin/azithromycin. We'll discharge home on an additional 5 days of therapy with Levaquin. -Followup final blood cultures.  Severe malnutrition in the context of chronic illness  -Seen by dietitian 05/17/2013. Given Ensure supplements.  -Recommend further outpatient evaluation. Tobacco abuse  -Counseled.  Provided with nicotine patch.  Hypokalemia   -Corrected.  Procedures:  Two-dimensional echocardiogram 05/16/2013: Study Conclusions  - Left ventricle: The cavity size was normal. Systolic function was normal. The estimated ejection fraction was in the range of 60% to 65%. - Atrial septum: No defect or patent foramen ovale was identified.   Consultations:  None.  Discharge Exam: Filed Vitals:   05/18/13 0450  BP: 113/58  Pulse: 66  Temp: 98.5 F (36.9 C)  Resp: 18   Filed Vitals:   05/17/13 0446 05/17/13 1457 05/17/13 2105 05/18/13 0450  BP: 111/54 125/59 122/59 113/58  Pulse: 70 75 69 66  Temp: 99.2 F (37.3 C) 100.5 F (38.1 C) 98.6 F (37 C) 98.5 F (36.9 C)  TempSrc: Oral Oral Oral Oral  Resp: 18 16 16 18   Height:      Weight:      SpO2: 98% 96% 96% 98%    Gen:  NAD Cardiovascular:  RRR, No M/R/G Respiratory: Lungs diminished Gastrointestinal: Abdomen soft, NT/ND with normal active bowel sounds. Extremities: No C/E/C   Discharge Instructions  Discharge Orders   Future Orders Complete By Expires   Call MD for:  temperature >100.4  As directed    Call MD for:  As directed    Scheduling Instructions:     Worsening breathing, cough with yellow or green sputum.   Diet general  As directed    Discharge instructions  As directed    Comments:     Make sure you followup with your primary care doctor 1 week post discharge.  We strongly encourage you to discontinue smoking.  Take all of your antibiotics as prescribed until they are gone, even if you feel well.  You were cared for by Dr. Hillery Aldo  (a hospitalist) during your hospital stay. If you have any questions about your discharge  medications or the care you received while you were in the hospital after you are discharged, you can call the unit and ask to speak with the hospitalist on call if the hospitalist that took care of you is not available. Once you are discharged, your primary care  physician will handle any further medical issues. Please note that NO REFILLS for any discharge medications will be authorized once you are discharged, as it is imperative that you return to your primary care physician (or establish a relationship with a primary care physician if you do not have one) for your aftercare needs so that they can reassess your need for medications and monitor your lab values.  Any outstanding tests can be reviewed by your PCP at your follow up visit.  It is also important to review any medicine changes with your PCP.  Please bring these d/c instructions with you to your next visit so your physician can review these changes with you.  If you do not have a primary care physician, you can call [phone] for a physician referral.  It is highly recommended that you obtain a PCP for hospital follow up.   Increase activity slowly  As directed        Medication List    STOP taking these medications       doxycycline 100 MG tablet  Commonly known as:  ADOXA     NYQUIL PO      TAKE these medications       ADVIL 200 MG Caps  Generic drug:  Ibuprofen  Take 2 capsules by mouth daily as needed (pain).     dextromethorphan-guaiFENesin 30-600 MG per 12 hr tablet  Commonly known as:  MUCINEX DM  Take 1 tablet by mouth every 12 (twelve) hours.     levofloxacin 750 MG tablet  Commonly known as:  LEVAQUIN  Take 1 tablet (750 mg total) by mouth daily.           Follow-up Information   Follow up with Kaleen Mask, MD. Schedule an appointment as soon as possible for a visit in 1 week.   Specialty:  Family Medicine   Contact information:   [address] [place] [address] [phone]        The results of significant diagnostics from this hospitalization (including imaging, microbiology, ancillary and laboratory) are listed below for reference.    Significant Diagnostic Studies: Dg Chest 2 View  05/15/2013   CLINICAL DATA:  Smoker with cough,  congestion and shortness of breath.  EXAM: CHEST  2 VIEW  COMPARISON:  None.  FINDINGS: The heart size and mediastinal contours are normal. There is diffuse interstitial prominence at both lung bases suspicious for interstitial edema. There are small bilateral pleural effusions. Asymmetric left basilar opacity likely represents atelectasis. The bones appear diffusely demineralized. No acute osseous findings are seen.  IMPRESSION: Suspected interstitial pulmonary edema with small bilateral pleural effusions. There is patchy left lower lobe airspace disease which may reflect atelectasis or pneumonia.   Electronically Signed   By: Roxy Horseman M.D.   On: 05/15/2013 16:06    Labs:  Basic Metabolic Panel:  Recent Labs Lab 05/15/13 1448 05/16/13 0421 05/17/13 0420 05/18/13 0445  NA 137 136 133* 133*  K 3.3* 3.1* 3.5 3.7  CL 98 100 98 96  CO2 26 26 26 28   GLUCOSE 96 94 97 106*  BUN 20 16 13 8   CREATININE 0.65 0.64 0.57 0.49*  CALCIUM 9.4 9.1 8.7 8.8  GFR Estimated Creatinine Clearance: 48.6 ml/min (by C-G formula based on Cr of 0.49). Liver Function Tests:  Recent Labs Lab 05/15/13 1448  AST 12  ALT 14  ALKPHOS 85  BILITOT 0.6  PROT 6.5  ALBUMIN 2.6*   CBC:  Recent Labs Lab 05/15/13 1448 05/16/13 0421 05/17/13 0420 05/18/13 0445  WBC 14.8* 13.5* 10.7* 10.2  HGB 12.4 10.4* 11.1* 10.9*  HCT 35.8* 30.3* 32.1* 31.3*  MCV 92.0 92.1 93.0 94.3  PLT 376 359 426* 467*   Microbiology Recent Results (from the past 240 hour(s))  CULTURE, BLOOD (ROUTINE X 2)     Status: None   Collection Time    05/15/13  3:25 PM      Result Value Range Status   Specimen Description BLOOD LEFT ANTECUBITAL   Final   Special Requests NONE BOTTLES DRAWN AEROBIC AND ANAEROBIC 4CC   Final   Culture  Setup Time     Final   Value: 05/15/2013 20:15     Performed at [facility]   Culture     Final   Value:        BLOOD CULTURE RECEIVED NO GROWTH TO DATE CULTURE WILL BE HELD FOR 5 DAYS  BEFORE ISSUING A FINAL NEGATIVE REPORT     Performed at [facility]   Report Status PENDING   Incomplete  CULTURE, BLOOD (ROUTINE X 2)     Status: None   Collection Time    05/15/13  3:33 PM      Result Value Range Status   Specimen Description BLOOD LFA   Final   Special Requests NONE BOTTLES DRAWN AEROBIC AND ANAEROBIC 5CC   Final   Culture  Setup Time     Final   Value: 05/15/2013 20:15     Performed at [facility]   Culture     Final   Value:        BLOOD CULTURE RECEIVED NO GROWTH TO DATE CULTURE WILL BE HELD FOR 5 DAYS BEFORE ISSUING A FINAL NEGATIVE REPORT     Performed at [facility]   Report Status PENDING   Incomplete    Time coordinating discharge: 35 minutes.  Signed:  RAMA,CHRISTINA  Pager [phone] [facility] 05/18/2013, 10:17 AM

## 2013-05-18 NOTE — Progress Notes (Signed)
SATURATION QUALIFICATIONS: (This note is used to comply with regulatory documentation for home oxygen)  Patient Saturations on Room Air at Rest = 91  Patient Saturations on Room Air while Ambulating = 81  Patient Saturations on 2 Liters of oxygen while Ambulating = 90%  Please briefly explain why patient needs home oxygen:low O2 sat when ambulating.

## 2013-05-21 LAB — CULTURE, BLOOD (ROUTINE X 2)
Culture: NO GROWTH
Culture: NO GROWTH

## 2013-05-25 ENCOUNTER — Other Ambulatory Visit (HOSPITAL_COMMUNITY): Payer: Self-pay | Admitting: Family Medicine

## 2013-05-25 ENCOUNTER — Ambulatory Visit (HOSPITAL_COMMUNITY)
Admission: RE | Admit: 2013-05-25 | Discharge: 2013-05-25 | Disposition: A | Discharge status: Home / Self Care | Payer: No Typology Code available for payment source | Source: Ambulatory Visit | Attending: Family Medicine | Admitting: Family Medicine

## 2013-05-25 DIAGNOSIS — J189 Pneumonia, unspecified organism: Secondary | ICD-10-CM

## 2013-05-25 DIAGNOSIS — J449 Chronic obstructive pulmonary disease, unspecified: Secondary | ICD-10-CM | POA: Insufficient documentation

## 2013-05-25 DIAGNOSIS — J4489 Other specified chronic obstructive pulmonary disease: Secondary | ICD-10-CM | POA: Insufficient documentation

## 2013-05-28 ENCOUNTER — Encounter: Payer: Self-pay | Admitting: Internal Medicine

## 2013-05-28 ENCOUNTER — Ambulatory Visit (INDEPENDENT_AMBULATORY_CARE_PROVIDER_SITE_OTHER): Payer: No Typology Code available for payment source | Admitting: Internal Medicine

## 2013-05-28 VITALS — BP 90/60 | HR 62 | Temp 98.2°F | Ht 64.0 in | Wt 93.2 lb

## 2013-05-28 DIAGNOSIS — J449 Chronic obstructive pulmonary disease, unspecified: Secondary | ICD-10-CM

## 2013-05-28 DIAGNOSIS — J189 Pneumonia, unspecified organism: Secondary | ICD-10-CM

## 2013-05-28 DIAGNOSIS — J9600 Acute respiratory failure, unspecified whether with hypoxia or hypercapnia: Secondary | ICD-10-CM

## 2013-05-28 MED ORDER — LEVOFLOXACIN 750 MG PO TABS: 750.0000 mg | ORAL_TABLET | Freq: Every day | ORAL | Status: DC

## 2013-05-28 NOTE — Patient Instructions (Signed)
If cough gets worse or start running fever ok to take another 5 days of levaquin  Wear the oxygen as needed for now   Return Friday 11/14 for cxr - call sooner if needed

## 2013-05-28 NOTE — Assessment & Plan Note (Addendum)
Clinically improved with apparent cxr lag but doubt any need for abx or steroids (for "organizing pneumoina" given paucity of clinical findings  Will bring her back  for f/u cxr, nothing further to add at this point but did give her course of levaquin if any clinical signs of infection

## 2013-05-28 NOTE — Progress Notes (Signed)
Subjective:    Patient ID: Mindy Wilson, female    DOB: 25-May-1951, 62 y.o.   MRN: [id number]  HPI  62 yowf  with reported good baseline functonal status  quit smoking 05/05/13 @  acute onset cough and ultimately admitted [facility] with dx of pna/acute resp failure and still required 02 at discharge:   Admit date: 05/15/2013  Discharge date: 05/18/2013   Discharge Diagnoses:   Principal Problem:  Acute hypoxic respiratory failure  -Likely secondary to community-acquired pneumonia but also had edema on chest radiography.  -WBC normalized with empiric antibiotics.  -EF 60-65%, no evidence of CHF. Continue empiric antibiotics for treatment of pneumonia.  -Home oxygen set up.  Active Problems:  CAP (community acquired pneumonia)  -Strep pneumonia antigen negative, Legionella antigen negative. HIV nonreactive. Influenza A/B negative.  -Treated with empiric Rocephin/azithromycin. We'll discharge home on an additional 5 days of therapy with Levaquin.  -Followup final blood cultures.  Severe malnutrition in the context of chronic illness  -Seen by dietitian 05/17/2013. Given Ensure supplements.  -Recommend further outpatient evaluation.  Tobacco abuse  -Counseled. Provided with nicotine patch.  Hypokalemia  -Corrected.     05/28/2013 1st [facility] office visit/ Mindy Wilson post hosp f/u  Chief Complaint  Patient presents with  . Pulmonary Consult    Referred per Dr. Jeannetta Nap. Pt states getting over "lung infection" with recent hospital admit to [facility] from 05/15/13 to 05/18/13. She c/o "dry throat" but denies any cough or dyspnea.    still feel a little weak, cough is gone completely, breathing better to point where no longer using 02 consistently, not limited by sob but very limited activity since discharge   No obvious day to day or daytime variabilty or assoc chronic cough or cp or chest tightness, subjective wheeze overt sinus or hb symptoms. No unusual exp hx or h/o childhood pna/ asthma or  knowledge of premature birth.  Sleeping ok without nocturnal  or early am exacerbation  of respiratory  c/o's or need for noct saba. Also denies any obvious fluctuation of symptoms with weather or environmental changes or other aggravating or alleviating factors except as outlined above   Current Medications, Allergies, Complete Past Medical History, Past Surgical History, Family History, and Social History were reviewed in [employer] record.          Review of Systems  Constitutional: Negative for fever, chills and unexpected weight change.  HENT: Negative for congestion, dental problem, ear pain, nosebleeds, postnasal drip, rhinorrhea, sinus pressure, sneezing, sore throat, trouble swallowing and voice change.   Eyes: Negative for visual disturbance.  Respiratory: Negative for cough, choking and shortness of breath.   Cardiovascular: Negative for chest pain and leg swelling.  Gastrointestinal: Negative for vomiting, abdominal pain and diarrhea.  Genitourinary: Negative for difficulty urinating.  Musculoskeletal: Negative for arthralgias.  Skin: Negative for rash.  Neurological: Negative for tremors, syncope and headaches.  Hematological: Does not bruise/bleed easily.       Objective:   Physical Exam  amb thin wf nad  Wt Readings from Last 3 Encounters:  05/28/13 93 lb 3.2 oz (42.275 kg)  05/15/13 93 lb (42.185 kg)  06/25/12 98 lb 4 oz (44.566 kg)     HEENT mild turbinate edema.  Oropharynx no thrush or excess pnd or cobblestoning.  No JVD or cervical adenopathy. Mild accessory muscle hypertrophy. Trachea midline, nl thryroid. Chest was hyperinflated by percussion with diminished breath sounds and moderate increased exp time without wheeze. Hoover sign positive at  mid inspiration. Regular rate and rhythm without murmur gallop or rub or increase P2 or edema.  Abd: no hsm, nl excursion. Ext warm without cyanosis or clubbing.      cxr 05/25/13  1. Interval  worsening of bilateral lower lobe pneumonia since the  examination 05/15/2013, most confluent in the left lower lobe.  2. Resolution of the interstitial pulmonary edema versus  interstitial pneumonitis since that examination.  3. Stable baseline COPD/ emphysema.     Assessment & Plan:

## 2013-05-30 DIAGNOSIS — J42 Unspecified chronic bronchitis: Secondary | ICD-10-CM | POA: Insufficient documentation

## 2013-05-30 NOTE — Assessment & Plan Note (Addendum)
I reviewed the Flethcher curve with patient that basically indicates  if you quit smoking when your best day FEV1 is still well preserved it is highly unlikely you will progress to severe disease and informed the patient there was no medication on the market that has proven to change the curve or the likelihood of progression.  Therefore stopping smoking and maintaining abstinence is the most important aspect of care, not choice of inhalers or for that matter, doctors.    Needs to be set up for f/u pft's and no need for inhalers at this point as no limiting symptoms or tendency to aecopd

## 2013-05-30 NOTE — Assessment & Plan Note (Signed)
Resolved at this point

## 2013-06-05 ENCOUNTER — Ambulatory Visit (INDEPENDENT_AMBULATORY_CARE_PROVIDER_SITE_OTHER)
Admission: RE | Admit: 2013-06-05 | Discharge: 2013-06-05 | Disposition: A | Discharge status: Home / Self Care | Payer: No Typology Code available for payment source | Source: Ambulatory Visit | Attending: Internal Medicine | Admitting: Internal Medicine

## 2013-06-05 ENCOUNTER — Telehealth: Payer: Self-pay | Admitting: Internal Medicine

## 2013-06-05 ENCOUNTER — Ambulatory Visit (INDEPENDENT_AMBULATORY_CARE_PROVIDER_SITE_OTHER): Payer: No Typology Code available for payment source | Admitting: Internal Medicine

## 2013-06-05 ENCOUNTER — Encounter: Payer: Self-pay | Admitting: Internal Medicine

## 2013-06-05 VITALS — BP 122/82 | HR 66 | Temp 98.6°F | Ht 64.0 in | Wt 93.6 lb

## 2013-06-05 DIAGNOSIS — J9600 Acute respiratory failure, unspecified whether with hypoxia or hypercapnia: Secondary | ICD-10-CM

## 2013-06-05 DIAGNOSIS — Z23 Encounter for immunization: Secondary | ICD-10-CM

## 2013-06-05 DIAGNOSIS — J42 Unspecified chronic bronchitis: Secondary | ICD-10-CM

## 2013-06-05 DIAGNOSIS — J189 Pneumonia, unspecified organism: Secondary | ICD-10-CM

## 2013-06-05 NOTE — Telephone Encounter (Signed)
Ok to d/c 02  

## 2013-06-05 NOTE — Progress Notes (Signed)
Subjective:    Patient ID: Mindy Wilson, female    DOB: 1951-05-14, 62 y.o.   MRN: [id number]  HPI  78 yowf  with reported good baseline functonal status  quit smoking 05/05/13 @  acute onset cough and ultimately admitted [facility] with dx of pna/acute resp failure and still required 02 at discharge:   Admit date: 05/15/2013  Discharge date: 05/18/2013   Discharge Diagnoses:   Principal Problem:  Acute hypoxic respiratory failure  -Likely secondary to community-acquired pneumonia but also had edema on chest radiography.  -WBC normalized with empiric antibiotics.  -EF 60-65%, no evidence of CHF. Continue empiric antibiotics for treatment of pneumonia.  -Home oxygen set up.  Active Problems:  CAP (community acquired pneumonia)  -Strep pneumonia antigen negative, Legionella antigen negative. HIV nonreactive. Influenza A/B negative.  -Treated with empiric Rocephin/azithromycin. We'll discharge home on an additional 5 days of therapy with Levaquin.  -Followup final blood cultures.  Severe malnutrition in the context of chronic illness  -Seen by dietitian 05/17/2013. Given Ensure supplements.  -Recommend further outpatient evaluation.  Tobacco abuse  -Counseled. Provided with nicotine patch.  Hypokalemia  -Corrected.     05/28/2013 1st [place] [facility] office visit/ Mindy Wilson post hosp f/u - not using 02  Chief Complaint  Patient presents with  . Pulmonary Consult    Referred per Dr. Jeannetta Nap. Pt states getting over "lung infection" with recent hospital admit to [facility] from 05/15/13 to 05/18/13. She c/o "dry throat" but denies any cough or dyspnea.    still feel a little weak, cough is gone completely, breathing better to point where no longer using 02 consistently, not limited by sob but very limited activity since discharge  rec No change rx unless condition worsens > then take levquin cycle again      06/05/2013 f/u ov/Mindy Wilson re: f/u pna, did not take the extra levaquin Chief Complaint   Patient presents with  . Follow-up    Pt states her breathing is doing well and has very little cough no mucus production.  No new co's today.       No obvious day to day or daytime variabilty or assoc sob or cp or chest tightness, subjective wheeze overt sinus or hb symptoms. No unusual exp hx or h/o childhood pna/ asthma or knowledge of premature birth.  Sleeping ok without nocturnal  or early am exacerbation  of respiratory  c/o's or need for noct saba. Also denies any obvious fluctuation of symptoms with weather or environmental changes or other aggravating or alleviating factors except as outlined above   Current Medications, Allergies, Complete Past Medical History, Past Surgical History, Family History, and Social History were reviewed in [employer] record.  ROS  The following are not active complaints unless bolded sore throat, dysphagia, dental problems, itching, sneezing,  nasal congestion or excess/ purulent secretions, ear ache,   fever, chills, sweats, unintended wt loss, pleuritic or exertional cp, hemoptysis,  orthopnea pnd or leg swelling, presyncope, palpitations, heartburn, abdominal pain, anorexia, nausea, vomiting, diarrhea  or change in bowel or urinary habits, change in stools or urine, dysuria,hematuria,  rash, arthralgias, visual complaints, headache, numbness weakness or ataxia or problems with walking or coordination,  change in mood/affect or memory.                .       Objective:   Physical Exam  amb thin wf nad 06/05/2013     94  Wt Readings from Last 3 Encounters:  05/28/13 93 lb 3.2 oz (42.275 kg)  05/15/13 93 lb (42.185 kg)  06/25/12 98 lb 4 oz (44.566 kg)     HEENT mild turbinate edema.  Oropharynx no thrush or excess pnd or cobblestoning.  No JVD or cervical adenopathy. Mild accessory muscle hypertrophy. Trachea midline, nl thryroid. Chest was hyperinflated by percussion with diminished breath sounds and moderate  increased exp time without wheeze. Hoover sign positive at mid inspiration. Min insp crackles bases  Regular rate and rhythm without murmur gallop or rub or increase P2 or edema.  Abd: no hsm, nl excursion. Ext warm without cyanosis or clubbing.     CXR  06/05/2013 : Improving bibasilar infiltrates.     Assessment & Plan:

## 2013-06-05 NOTE — Patient Instructions (Signed)
Prevnar today   Please schedule a follow up office visit in 4 weeks, sooner if needed with cxr

## 2013-06-05 NOTE — Telephone Encounter (Signed)
I spoke with pt. She saw MW this morning and wants to know if we can send an order to D/C her O2. She reports she has not used it in 2 weeks. Please advise Dr. Sherene Sires thanks

## 2013-06-05 NOTE — Assessment & Plan Note (Addendum)
Resolving though definitely with cxr lag > f/u cxr in 4 weeks

## 2013-06-05 NOTE — Assessment & Plan Note (Signed)
-   spirometry 06/05/2013  FEV1  2.30 (96%) ratio 75   I reviewed the Flethcher curve with patient that basically indicates  if you quit smoking when your best day FEV1 is still well preserved ( which appears to have been the case here) it is highly unlikely you will progress to severe disease and informed the patient there was no medication on the market that has proven to change the curve or the likelihood of progression.  Therefore stopping smoking and maintaining abstinence is the most important aspect of care, not choice of inhalers or for that matter, doctors.

## 2013-06-05 NOTE — Telephone Encounter (Signed)
Order placed and pt aware. Nothing further needed 

## 2013-07-03 ENCOUNTER — Ambulatory Visit (INDEPENDENT_AMBULATORY_CARE_PROVIDER_SITE_OTHER)
Admission: RE | Admit: 2013-07-03 | Discharge: 2013-07-03 | Disposition: A | Discharge status: Home / Self Care | Payer: No Typology Code available for payment source | Source: Ambulatory Visit | Attending: Internal Medicine | Admitting: Internal Medicine

## 2013-07-03 ENCOUNTER — Encounter: Payer: Self-pay | Admitting: Internal Medicine

## 2013-07-03 ENCOUNTER — Ambulatory Visit (INDEPENDENT_AMBULATORY_CARE_PROVIDER_SITE_OTHER): Payer: No Typology Code available for payment source | Admitting: Internal Medicine

## 2013-07-03 VITALS — BP 106/80 | HR 72 | Temp 98.2°F | Ht 64.0 in | Wt 100.0 lb

## 2013-07-03 DIAGNOSIS — J189 Pneumonia, unspecified organism: Secondary | ICD-10-CM

## 2013-07-03 DIAGNOSIS — J42 Unspecified chronic bronchitis: Secondary | ICD-10-CM

## 2013-07-03 NOTE — Assessment & Plan Note (Signed)
Radiology is reading the density  as stable scarring in bases  but much less dense on lateral view and not clear therefore where her baseline will end up so probably  just needs to do one more cxr in 4 weeks to establish baseline for future reference but the chances this represents anything more than post pna inflammatory change is very low.

## 2013-07-03 NOTE — Patient Instructions (Signed)
Please schedule a follow up office visit in 6 weeks, call sooner if needed with cxr on return  

## 2013-07-03 NOTE — Progress Notes (Signed)
Subjective:    Patient ID: Mindy Wilson, female    DOB: 1950-11-23, 62 y.o.   MRN: [id number]    Brief patient profile:  9 yowf  with reported good baseline functonal status  quit smoking 05/05/13 @  acute onset cough and ultimately admitted [facility] with dx of pna/acute resp failure and still required 02 at discharge:  History of Present Illness   Admit date: 05/15/2013  Discharge date: 05/18/2013   Discharge Diagnoses:   Principal Problem:  Acute hypoxic respiratory failure  -Likely secondary to community-acquired pneumonia but also had edema on chest radiography.  -WBC normalized with empiric antibiotics.  -EF 60-65%, no evidence of CHF. Continue empiric antibiotics for treatment of pneumonia.  -Home oxygen set up.  Active Problems:  CAP (community acquired pneumonia)  -Strep pneumonia antigen negative, Legionella antigen negative. HIV nonreactive. Influenza A/B negative.  -Treated with empiric Rocephin/azithromycin. We'll discharge home on an additional 5 days of therapy with Levaquin.  -Followup final blood cultures.  Severe malnutrition in the context of chronic illness  -Seen by dietitian 05/17/2013. Given Ensure supplements.  -Recommend further outpatient evaluation.  Tobacco abuse  -Counseled. Provided with nicotine patch.  Hypokalemia  -Corrected.     05/28/2013 1st [facility] office visit/ Wert post hosp f/u - not using 02  Chief Complaint  Patient presents with  . Pulmonary Consult    Referred per Dr. Jeannetta Nap. Pt states getting over "lung infection" with recent hospital admit to [facility] from 05/15/13 to 05/18/13. She c/o "dry throat" but denies any cough or dyspnea.    still feel a little weak, cough is gone completely, breathing better to point where no longer using 02 consistently, not limited by sob but very limited activity since discharge  rec No change rx unless condition worsens > then take levquin cycle again      06/05/2013 f/u ov/Wert re: f/u pna, did  not take the extra levaquin Chief Complaint  Patient presents with  . Follow-up    Pt states her breathing is doing well and has very little cough no mucus production.  No new co's today.    rec Prevnar today    07/03/2013 f/u ov/Wert re: f/u pna  Chief Complaint  Patient presents with  . Followup with CXR    Pt states her breathing is doing well and she denies any co's today.    using no inhalers or cough meds at all, gaining wt, still craving cigs   No obvious day to day or daytime variabilty or assoc sob or cp or chest tightness, subjective wheeze overt sinus or hb symptoms. No unusual exp hx or h/o childhood pna/ asthma or knowledge of premature birth.  Sleeping ok without nocturnal  or early am exacerbation  of respiratory  c/o's or need for noct saba. Also denies any obvious fluctuation of symptoms with weather or environmental changes or other aggravating or alleviating factors except as outlined above   Current Medications, Allergies, Complete Past Medical History, Past Surgical History, Family History, and Social History were reviewed in [employer] record.  ROS  The following are not active complaints unless bolded sore throat, dysphagia, dental problems, itching, sneezing,  nasal congestion or excess/ purulent secretions, ear ache,   fever, chills, sweats, unintended wt loss, pleuritic or exertional cp, hemoptysis,  orthopnea pnd or leg swelling, presyncope, palpitations, heartburn, abdominal pain, anorexia, nausea, vomiting, diarrhea  or change in bowel or urinary habits, change in stools or urine, dysuria,hematuria,  rash, arthralgias, visual  complaints, headache, numbness weakness or ataxia or problems with walking or coordination,  change in mood/affect or memory.                .       Objective:   Physical Exam  amb thin wf nad 06/05/2013     94 > 07/03/2013  100 Wt Readings from Last 3 Encounters:  05/28/13 93 lb 3.2 oz (42.275 kg)   05/15/13 93 lb (42.185 kg)  06/25/12 98 lb 4 oz (44.566 kg)     HEENT mild turbinate edema.  Oropharynx no thrush or excess pnd or cobblestoning.  No JVD or cervical adenopathy. Mild accessory muscle hypertrophy. Trachea midline, nl thryroid. Chest was hyperinflated by percussion with diminished breath sounds and moderate increased exp time without wheeze. Hoover sign positive at mid inspiration. Min insp pops and squeaks L base  Regular rate and rhythm without murmur gallop or rub or increase P2 or edema.  Abd: no hsm, nl excursion. Ext warm without cyanosis or clubbing.     CXR  07/03/2013 :   Findings consistent with chronic obstructive pulmonary disease. Stable bilateral basilar opacities are noted most consistent with scarring. No significant change compared to prior exam.      Assessment & Plan:

## 2013-07-03 NOTE — Assessment & Plan Note (Signed)
-   spirometry 06/05/2013  FEV1  2.30 (96%) ratio 75   Key is to maintain off cigs longterm

## 2013-08-13 ENCOUNTER — Ambulatory Visit (INDEPENDENT_AMBULATORY_CARE_PROVIDER_SITE_OTHER): Payer: No Typology Code available for payment source | Admitting: Internal Medicine

## 2013-08-13 ENCOUNTER — Encounter: Payer: Self-pay | Admitting: Internal Medicine

## 2013-08-13 ENCOUNTER — Ambulatory Visit (INDEPENDENT_AMBULATORY_CARE_PROVIDER_SITE_OTHER)
Admission: RE | Admit: 2013-08-13 | Discharge: 2013-08-13 | Disposition: A | Discharge status: Home / Self Care | Payer: No Typology Code available for payment source | Source: Ambulatory Visit | Attending: Internal Medicine | Admitting: Internal Medicine

## 2013-08-13 VITALS — BP 122/76 | HR 63 | Temp 98.8°F | Ht 64.0 in | Wt 110.0 lb

## 2013-08-13 DIAGNOSIS — J189 Pneumonia, unspecified organism: Secondary | ICD-10-CM

## 2013-08-13 DIAGNOSIS — J42 Unspecified chronic bronchitis: Secondary | ICD-10-CM

## 2013-08-13 MED ORDER — BUPROPION HCL ER (SR) 150 MG PO TB12: ORAL_TABLET | ORAL | Status: DC

## 2013-08-13 NOTE — Progress Notes (Signed)
Subjective:    Patient ID: Mindy Wilson, female    DOB: [birth year], 63 y.o.   MRN: [id number]    Brief patient profile:  12 yowf  with reported good baseline functonal status  quit smoking 05/05/13 @  acute onset cough and ultimately admitted [facility] with dx of pna/acute resp failure and still required 02 at discharge:  History of Present Illness   Admit date: 05/15/2013  Discharge date: 05/18/2013   Discharge Diagnoses:   Principal Problem:  Acute hypoxic respiratory failure  -Likely secondary to community-acquired pneumonia but also had edema on chest radiography.  -WBC normalized with empiric antibiotics.  -EF 60-65%, no evidence of CHF. Continue empiric antibiotics for treatment of pneumonia.  -Home oxygen set up.  Active Problems:  CAP (community acquired pneumonia)  -Strep pneumonia antigen negative, Legionella antigen negative. HIV nonreactive. Influenza A/B negative.  -Treated with empiric Rocephin/azithromycin. We'll discharge home on an additional 5 days of therapy with Levaquin.  -Followup final blood cultures.  Severe malnutrition in the context of chronic illness  -Seen by dietitian 05/17/2013. Given Ensure supplements.  -Recommend further outpatient evaluation.  Tobacco abuse  -Counseled. Provided with nicotine patch.  Hypokalemia  -Corrected.     05/28/2013 1st [facility] office visit/ Mindy Wilson post hosp f/u - not using 02  Chief Complaint  Patient presents with  . Pulmonary Consult    Referred per Dr. Arelia Sneddon. Pt states getting over "lung infection" with recent hospital admit to [facility] from 05/15/13 to 05/18/13. She c/o "dry throat" but denies any cough or dyspnea.    still feel a little weak, cough is gone completely, breathing better to point where no longer using 02 consistently, not limited by sob but very limited activity since discharge  rec No change rx unless condition worsens > then take levquin cycle again      06/05/2013 f/u ov/Mindy Wilson re: f/u pna, did  not take the extra levaquin Chief Complaint  Patient presents with  . Follow-up    Pt states her breathing is doing well and has very little cough no mucus production.  No new co's today.    rec Prevnar today    07/03/2013 f/u ov/Mindy Wilson re: f/u pna  Chief Complaint  Patient presents with  . Followup with CXR    Pt states her breathing is doing well and she denies any co's today.    using no inhalers or cough meds at all, gaining wt, still craving cigs  rec No change rx    08/13/2013 f/u ov/Mindy Wilson re: f/u pna Chief Complaint  Patient presents with  . Followup with CXR    Breathing is doing well and no new co's today.    denies being limited from desired activities by sob  No obvious day to day or daytime variabilty or assoc sob or cp or chest tightness, subjective wheeze overt sinus or hb symptoms. No unusual exp hx or h/o childhood pna/ asthma or knowledge of premature birth.  Sleeping ok without nocturnal  or early am exacerbation  of respiratory  c/o's or need for noct saba. Also denies any obvious fluctuation of symptoms with weather or environmental changes or other aggravating or alleviating factors except as outlined above   Current Medications, Allergies, Complete Past Medical History, Past Surgical History, Family History, and Social History were reviewed in [employer] record.  ROS  The following are not active complaints unless bolded sore throat, dysphagia, dental problems, itching, sneezing,  nasal congestion or excess/ purulent secretions, ear  ache,   fever, chills, sweats, unintended wt loss, pleuritic or exertional cp, hemoptysis,  orthopnea pnd or leg swelling, presyncope, palpitations, heartburn, abdominal pain, anorexia, nausea, vomiting, diarrhea  or change in bowel or urinary habits, change in stools or urine, dysuria,hematuria,  rash, arthralgias, visual complaints, headache, numbness weakness or ataxia or problems with walking or coordination,   change in mood/affect or memory.                .       Objective:   Physical Exam  amb thin wf nad 06/05/2013     94 > 07/03/2013  100 > 110 08/13/2013  Wt Readings from Last 3 Encounters:  05/28/13 93 lb 3.2 oz (42.275 kg)  05/15/13 93 lb (42.185 kg)  06/25/12 98 lb 4 oz (44.566 kg)     HEENT mild turbinate edema.  Oropharynx no thrush or excess pnd or cobblestoning.  No JVD or cervical adenopathy. Mild accessory muscle hypertrophy. Trachea midline, nl thryroid. Chest was hyperinflated by percussion with diminished breath sounds and moderate increased exp time without wheeze. Hoover sign positive at mid inspiration. Min insp pops and squeaks R> L base  Regular rate and rhythm without murmur gallop or rub or increase P2 or edema.  Abd: no hsm, nl excursion. Ext warm without cyanosis or clubbing.     CXR  07/03/2013 :   Findings consistent with chronic obstructive pulmonary disease. Stable bilateral basilar opacities are noted most consistent with scarring. No significant change compared to prior exam.  CXR  08/13/2013 :   Overall improved aeration of the lungs with persistent right basilar opacities - while possibly atelectasis or scar, given emphysematous change a discrete underlying pulmonary nodule/opacity is not excluded. Imaging strategies include further evaluation with an additional follow-up chest radiograph in 2 -3 months, or alternatively, further evaluation may be performed with chest CT as clinically indicated. My review: serial very striking improvement in aeration in R base esp on lateral view     Assessment & Plan:

## 2013-08-13 NOTE — Patient Instructions (Addendum)
wellburtrin 150 mg with bfast - see Dr Arelia Sneddon by the time it runs out for follow up and adjustment of dose if needed  If problems with insomnia ok to use Tylenol pm otc   If you are satisfied with your treatment plan let your doctor know and he/she can either refill your medications or you can return here when your prescription runs out.     If in any way you are not 100% satisfied,  please tell us.  If 100% better, tell your friends!

## 2013-08-13 NOTE — Assessment & Plan Note (Signed)
Almost complete resolution > no further x rays needed

## 2013-08-13 NOTE — Assessment & Plan Note (Addendum)
-   spirometry 06/05/2013  FEV1  2.30 (96%) ratio 75   Much better off cigs, no further f/u needed   She did request start wellbutrin due to cigarette craving so rx with 150 mg daily until sees her primary doc

## 2013-08-14 ENCOUNTER — Ambulatory Visit: Payer: No Typology Code available for payment source | Admitting: Internal Medicine

## 2015-03-20 IMAGING — CR DG CHEST 2V
2 series · 2 of 2 positions shown · non-contrast
Comparison: 07/13/2013; 05/25/2013; 05/15/2013

CLINICAL DATA: Pneumonia in [REDACTED] and Afridi, follow-up
examination

EXAM:
CHEST  2 VIEW

[view not recorded (1 of 2)]
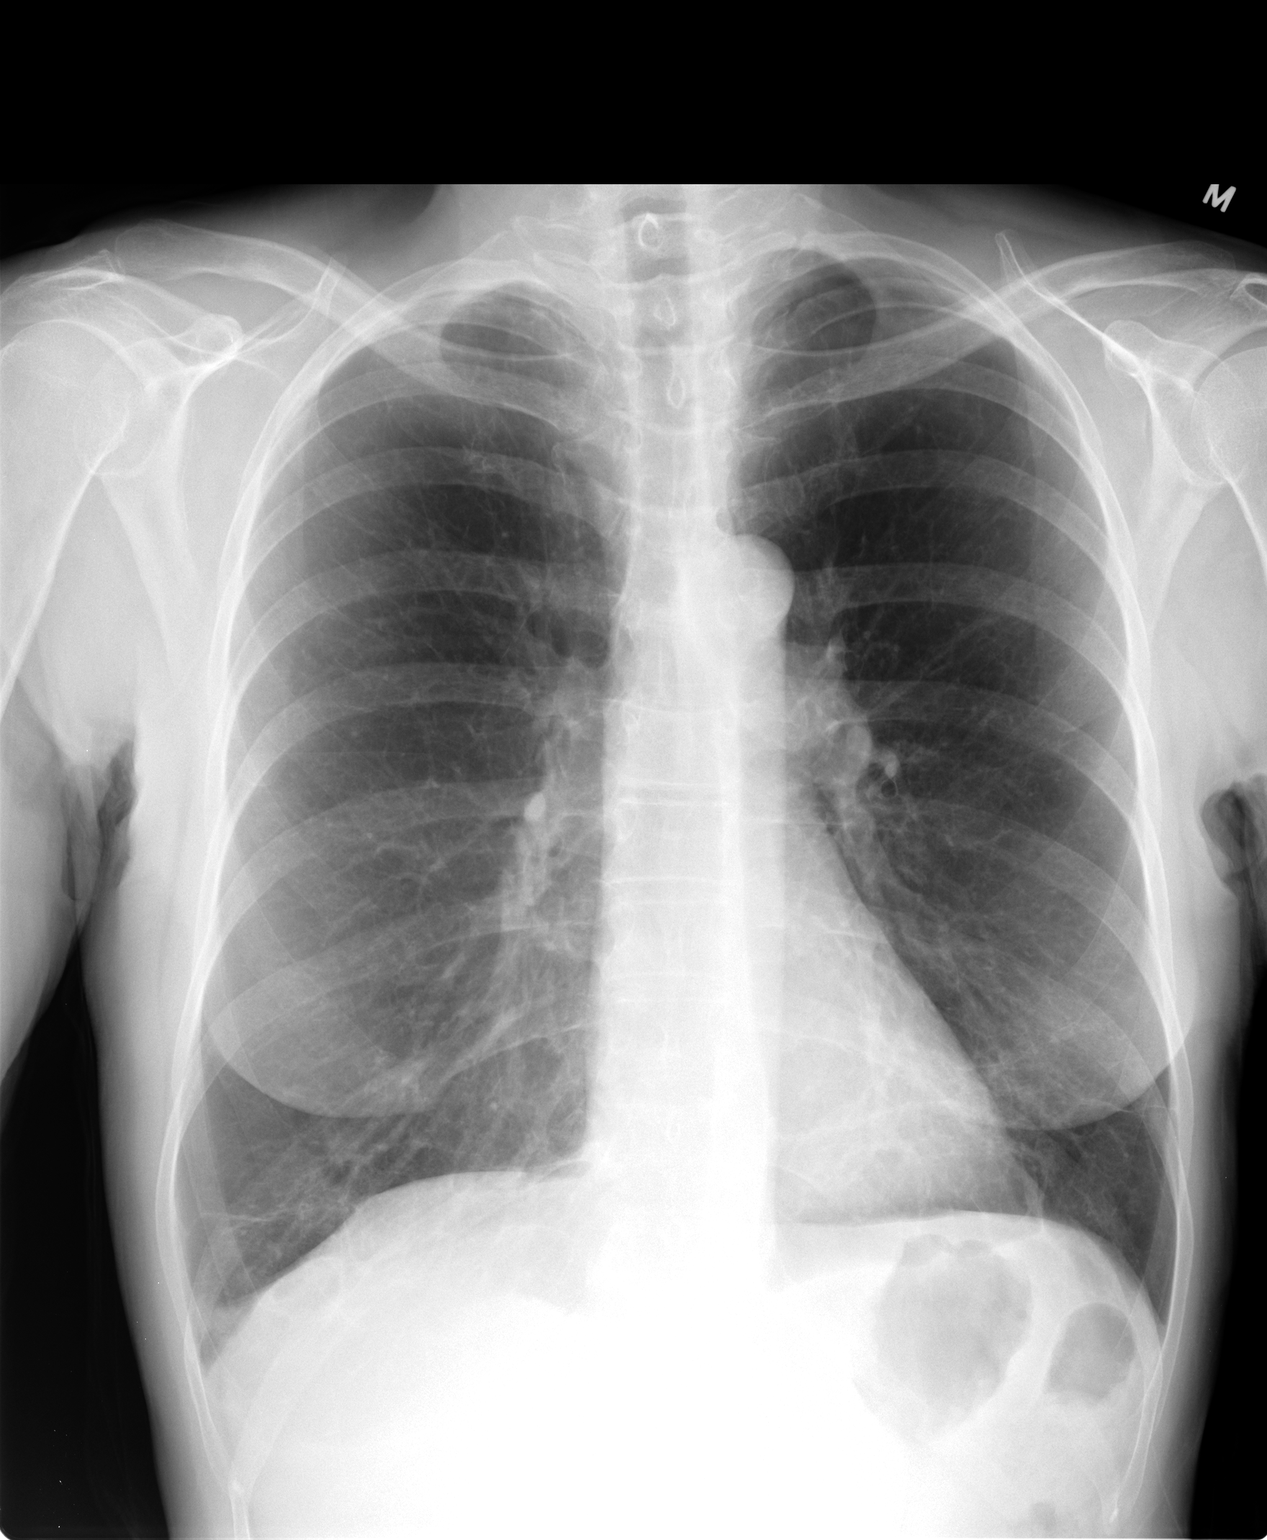

[view not recorded (2 of 2)]
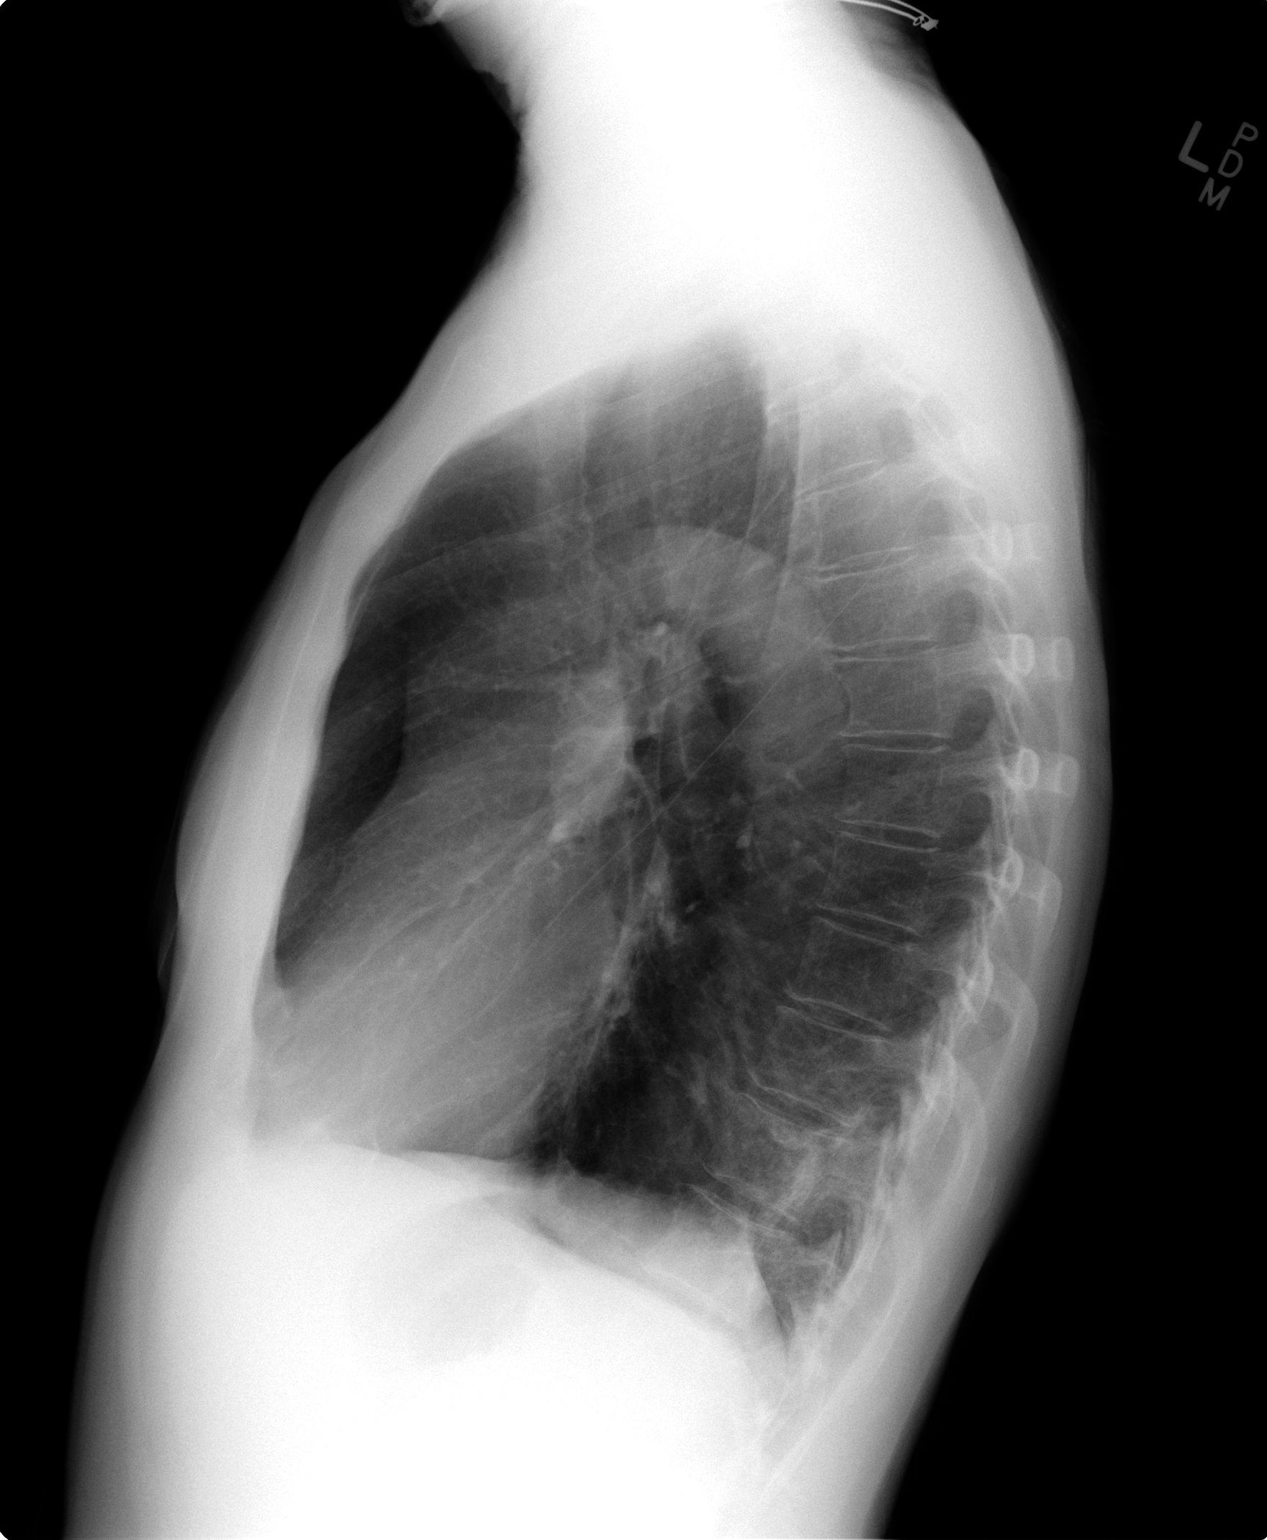

[2 of 2 positions shown; findings below may reference images not displayed]

FINDINGS: Grossly unchanged cardiac silhouette and mediastinal contours. Lungs
remain hyperexpanded. Improved aeration of the bilateral lower lung
with persistent though improved right basilar heterogeneous
opacities. No new focal airspace opacities. No pleural effusion or
pneumothorax. There is pleural parenchymal thickening about the
major fissure. Grossly unchanged bones.
IMPRESSION: Overall improved aeration of the lungs with persistent right basilar
opacities - while possibly atelectasis or scar, given emphysematous
change a discrete underlying pulmonary nodule/opacity is not
excluded. Imaging strategies include further evaluation with an
additional follow-up chest radiograph in 2 -3 months, or
alternatively, further evaluation may be performed with chest CT as
clinically indicated.

## 2016-02-07 DIAGNOSIS — L299 Pruritus, unspecified: Secondary | ICD-10-CM | POA: Diagnosis not present

## 2016-02-07 DIAGNOSIS — Z0000 Encounter for general adult medical examination without abnormal findings: Secondary | ICD-10-CM | POA: Diagnosis not present

## 2016-02-27 DIAGNOSIS — H2513 Age-related nuclear cataract, bilateral: Secondary | ICD-10-CM | POA: Diagnosis not present

## 2016-02-27 DIAGNOSIS — H40033 Anatomical narrow angle, bilateral: Secondary | ICD-10-CM | POA: Diagnosis not present

## 2016-05-22 DIAGNOSIS — H04123 Dry eye syndrome of bilateral lacrimal glands: Secondary | ICD-10-CM | POA: Diagnosis not present

## 2016-08-23 DIAGNOSIS — H5213 Myopia, bilateral: Secondary | ICD-10-CM | POA: Diagnosis not present

## 2016-08-23 DIAGNOSIS — H25042 Posterior subcapsular polar age-related cataract, left eye: Secondary | ICD-10-CM | POA: Diagnosis not present

## 2016-08-23 DIAGNOSIS — H52223 Regular astigmatism, bilateral: Secondary | ICD-10-CM | POA: Diagnosis not present

## 2016-08-23 DIAGNOSIS — H524 Presbyopia: Secondary | ICD-10-CM | POA: Diagnosis not present

## 2016-10-03 DIAGNOSIS — R42 Dizziness and giddiness: Secondary | ICD-10-CM | POA: Diagnosis not present

## 2016-10-03 DIAGNOSIS — D7589 Other specified diseases of blood and blood-forming organs: Secondary | ICD-10-CM | POA: Diagnosis not present

## 2016-10-03 DIAGNOSIS — J989 Respiratory disorder, unspecified: Secondary | ICD-10-CM | POA: Diagnosis not present

## 2016-10-12 DIAGNOSIS — D649 Anemia, unspecified: Secondary | ICD-10-CM | POA: Diagnosis not present

## 2016-10-12 DIAGNOSIS — D510 Vitamin B12 deficiency anemia due to intrinsic factor deficiency: Secondary | ICD-10-CM | POA: Diagnosis not present

## 2016-11-12 DIAGNOSIS — D519 Vitamin B12 deficiency anemia, unspecified: Secondary | ICD-10-CM | POA: Diagnosis not present

## 2016-11-12 DIAGNOSIS — E538 Deficiency of other specified B group vitamins: Secondary | ICD-10-CM | POA: Diagnosis not present

## 2017-05-13 DIAGNOSIS — H524 Presbyopia: Secondary | ICD-10-CM | POA: Diagnosis not present

## 2017-05-13 DIAGNOSIS — H5213 Myopia, bilateral: Secondary | ICD-10-CM | POA: Diagnosis not present

## 2017-05-13 DIAGNOSIS — H52223 Regular astigmatism, bilateral: Secondary | ICD-10-CM | POA: Diagnosis not present

## 2017-11-27 DIAGNOSIS — H2513 Age-related nuclear cataract, bilateral: Secondary | ICD-10-CM | POA: Diagnosis not present

## 2017-11-27 DIAGNOSIS — H40033 Anatomical narrow angle, bilateral: Secondary | ICD-10-CM | POA: Diagnosis not present

## 2018-03-28 DIAGNOSIS — R300 Dysuria: Secondary | ICD-10-CM | POA: Diagnosis not present

## 2018-03-28 DIAGNOSIS — Z136 Encounter for screening for cardiovascular disorders: Secondary | ICD-10-CM | POA: Diagnosis not present

## 2018-03-28 DIAGNOSIS — E538 Deficiency of other specified B group vitamins: Secondary | ICD-10-CM | POA: Diagnosis not present

## 2018-03-28 DIAGNOSIS — R945 Abnormal results of liver function studies: Secondary | ICD-10-CM | POA: Diagnosis not present

## 2018-03-28 DIAGNOSIS — R6882 Decreased libido: Secondary | ICD-10-CM | POA: Diagnosis not present

## 2018-03-28 DIAGNOSIS — R5383 Other fatigue: Secondary | ICD-10-CM | POA: Diagnosis not present

## 2018-04-11 DIAGNOSIS — R319 Hematuria, unspecified: Secondary | ICD-10-CM | POA: Diagnosis not present

## 2018-06-02 DIAGNOSIS — R3121 Asymptomatic microscopic hematuria: Secondary | ICD-10-CM | POA: Diagnosis not present

## 2018-06-16 DIAGNOSIS — R3121 Asymptomatic microscopic hematuria: Secondary | ICD-10-CM | POA: Diagnosis not present

## 2018-06-16 DIAGNOSIS — N289 Disorder of kidney and ureter, unspecified: Secondary | ICD-10-CM | POA: Diagnosis not present

## 2018-06-24 DIAGNOSIS — N952 Postmenopausal atrophic vaginitis: Secondary | ICD-10-CM | POA: Diagnosis not present

## 2018-06-24 DIAGNOSIS — D4101 Neoplasm of uncertain behavior of right kidney: Secondary | ICD-10-CM | POA: Diagnosis not present

## 2018-06-24 DIAGNOSIS — R3121 Asymptomatic microscopic hematuria: Secondary | ICD-10-CM | POA: Diagnosis not present

## 2018-07-01 DIAGNOSIS — L2089 Other atopic dermatitis: Secondary | ICD-10-CM | POA: Diagnosis not present

## 2018-07-01 DIAGNOSIS — Z1211 Encounter for screening for malignant neoplasm of colon: Secondary | ICD-10-CM | POA: Diagnosis not present

## 2018-09-23 DIAGNOSIS — J069 Acute upper respiratory infection, unspecified: Secondary | ICD-10-CM | POA: Diagnosis not present

## 2018-09-23 DIAGNOSIS — Z1211 Encounter for screening for malignant neoplasm of colon: Secondary | ICD-10-CM | POA: Diagnosis not present

## 2019-03-04 DIAGNOSIS — D4101 Neoplasm of uncertain behavior of right kidney: Secondary | ICD-10-CM | POA: Diagnosis not present

## 2019-03-09 DIAGNOSIS — K7689 Other specified diseases of liver: Secondary | ICD-10-CM | POA: Diagnosis not present

## 2019-03-09 DIAGNOSIS — C641 Malignant neoplasm of right kidney, except renal pelvis: Secondary | ICD-10-CM | POA: Diagnosis not present

## 2019-03-09 DIAGNOSIS — D4101 Neoplasm of uncertain behavior of right kidney: Secondary | ICD-10-CM | POA: Diagnosis not present

## 2019-03-24 DIAGNOSIS — D4101 Neoplasm of uncertain behavior of right kidney: Secondary | ICD-10-CM | POA: Diagnosis not present

## 2019-03-24 DIAGNOSIS — R3121 Asymptomatic microscopic hematuria: Secondary | ICD-10-CM | POA: Diagnosis not present

## 2019-03-24 DIAGNOSIS — N952 Postmenopausal atrophic vaginitis: Secondary | ICD-10-CM | POA: Diagnosis not present

## 2019-07-24 DIAGNOSIS — H04123 Dry eye syndrome of bilateral lacrimal glands: Secondary | ICD-10-CM | POA: Diagnosis not present

## 2019-07-24 DIAGNOSIS — H40033 Anatomical narrow angle, bilateral: Secondary | ICD-10-CM | POA: Diagnosis not present

## 2020-01-08 DIAGNOSIS — F17200 Nicotine dependence, unspecified, uncomplicated: Secondary | ICD-10-CM | POA: Diagnosis not present

## 2020-01-08 DIAGNOSIS — R945 Abnormal results of liver function studies: Secondary | ICD-10-CM | POA: Diagnosis not present

## 2020-01-08 DIAGNOSIS — H60501 Unspecified acute noninfective otitis externa, right ear: Secondary | ICD-10-CM | POA: Diagnosis not present

## 2020-01-08 DIAGNOSIS — R636 Underweight: Secondary | ICD-10-CM | POA: Diagnosis not present

## 2020-01-08 DIAGNOSIS — Z1211 Encounter for screening for malignant neoplasm of colon: Secondary | ICD-10-CM | POA: Diagnosis not present

## 2020-05-09 DIAGNOSIS — H40033 Anatomical narrow angle, bilateral: Secondary | ICD-10-CM | POA: Diagnosis not present

## 2020-05-09 DIAGNOSIS — H2513 Age-related nuclear cataract, bilateral: Secondary | ICD-10-CM | POA: Diagnosis not present

## 2020-05-12 DIAGNOSIS — R7989 Other specified abnormal findings of blood chemistry: Secondary | ICD-10-CM | POA: Diagnosis not present

## 2020-05-12 DIAGNOSIS — E875 Hyperkalemia: Secondary | ICD-10-CM | POA: Diagnosis not present

## 2020-05-12 DIAGNOSIS — Z23 Encounter for immunization: Secondary | ICD-10-CM | POA: Diagnosis not present

## 2020-05-26 DIAGNOSIS — L723 Sebaceous cyst: Secondary | ICD-10-CM | POA: Diagnosis not present

## 2020-05-26 DIAGNOSIS — Z1211 Encounter for screening for malignant neoplasm of colon: Secondary | ICD-10-CM | POA: Diagnosis not present

## 2020-06-20 DIAGNOSIS — R3121 Asymptomatic microscopic hematuria: Secondary | ICD-10-CM | POA: Diagnosis not present

## 2020-06-20 DIAGNOSIS — D4101 Neoplasm of uncertain behavior of right kidney: Secondary | ICD-10-CM | POA: Diagnosis not present

## 2020-06-20 DIAGNOSIS — N952 Postmenopausal atrophic vaginitis: Secondary | ICD-10-CM | POA: Diagnosis not present

## 2020-06-22 DIAGNOSIS — L720 Epidermal cyst: Secondary | ICD-10-CM | POA: Diagnosis not present

## 2020-08-23 DIAGNOSIS — H2511 Age-related nuclear cataract, right eye: Secondary | ICD-10-CM | POA: Diagnosis not present

## 2020-08-23 DIAGNOSIS — H2513 Age-related nuclear cataract, bilateral: Secondary | ICD-10-CM | POA: Diagnosis not present

## 2020-08-23 DIAGNOSIS — H18413 Arcus senilis, bilateral: Secondary | ICD-10-CM | POA: Diagnosis not present

## 2020-08-23 DIAGNOSIS — H25043 Posterior subcapsular polar age-related cataract, bilateral: Secondary | ICD-10-CM | POA: Diagnosis not present

## 2020-08-23 DIAGNOSIS — H25013 Cortical age-related cataract, bilateral: Secondary | ICD-10-CM | POA: Diagnosis not present

## 2020-10-31 DIAGNOSIS — Z961 Presence of intraocular lens: Secondary | ICD-10-CM | POA: Diagnosis not present

## 2020-10-31 DIAGNOSIS — H2513 Age-related nuclear cataract, bilateral: Secondary | ICD-10-CM | POA: Diagnosis not present

## 2020-10-31 DIAGNOSIS — H2511 Age-related nuclear cataract, right eye: Secondary | ICD-10-CM | POA: Diagnosis not present

## 2020-11-01 DIAGNOSIS — H2512 Age-related nuclear cataract, left eye: Secondary | ICD-10-CM | POA: Diagnosis not present

## 2020-11-21 DIAGNOSIS — H2512 Age-related nuclear cataract, left eye: Secondary | ICD-10-CM | POA: Diagnosis not present

## 2020-12-23 DIAGNOSIS — H43393 Other vitreous opacities, bilateral: Secondary | ICD-10-CM | POA: Diagnosis not present

## 2021-01-27 DIAGNOSIS — N390 Urinary tract infection, site not specified: Secondary | ICD-10-CM | POA: Diagnosis not present

## 2021-01-27 DIAGNOSIS — J309 Allergic rhinitis, unspecified: Secondary | ICD-10-CM | POA: Diagnosis not present

## 2021-04-27 DIAGNOSIS — Z23 Encounter for immunization: Secondary | ICD-10-CM | POA: Diagnosis not present

## 2021-04-27 DIAGNOSIS — L299 Pruritus, unspecified: Secondary | ICD-10-CM | POA: Diagnosis not present

## 2021-04-27 DIAGNOSIS — D649 Anemia, unspecified: Secondary | ICD-10-CM | POA: Diagnosis not present

## 2021-06-12 DIAGNOSIS — I700 Atherosclerosis of aorta: Secondary | ICD-10-CM | POA: Diagnosis not present

## 2021-06-12 DIAGNOSIS — D4101 Neoplasm of uncertain behavior of right kidney: Secondary | ICD-10-CM | POA: Diagnosis not present

## 2021-06-12 DIAGNOSIS — K7689 Other specified diseases of liver: Secondary | ICD-10-CM | POA: Diagnosis not present

## 2021-06-12 DIAGNOSIS — N281 Cyst of kidney, acquired: Secondary | ICD-10-CM | POA: Diagnosis not present

## 2021-06-19 DIAGNOSIS — N952 Postmenopausal atrophic vaginitis: Secondary | ICD-10-CM | POA: Diagnosis not present

## 2021-06-19 DIAGNOSIS — D4101 Neoplasm of uncertain behavior of right kidney: Secondary | ICD-10-CM | POA: Diagnosis not present

## 2022-01-21 ENCOUNTER — Emergency Department (HOSPITAL_COMMUNITY)
Admission: EM | Admit: 2022-01-21 | Discharge: 2022-01-22 | Disposition: A | Discharge status: Home / Self Care | Payer: PPO | Attending: Emergency Medicine | Admitting: Emergency Medicine

## 2022-01-21 ENCOUNTER — Other Ambulatory Visit: Payer: Self-pay

## 2022-01-21 DIAGNOSIS — Z87891 Personal history of nicotine dependence: Secondary | ICD-10-CM | POA: Diagnosis not present

## 2022-01-21 DIAGNOSIS — I4891 Unspecified atrial fibrillation: Secondary | ICD-10-CM

## 2022-01-21 DIAGNOSIS — E876 Hypokalemia: Secondary | ICD-10-CM | POA: Insufficient documentation

## 2022-01-21 DIAGNOSIS — R000 Tachycardia, unspecified: Secondary | ICD-10-CM | POA: Diagnosis present

## 2022-01-21 NOTE — ED Triage Notes (Signed)
Pt to ED via [facility] from home.  Pt started feeling like her heart was racing at approximately 2200. EMS noted afib w/RVR HR=150s-200s.  Pt on prednisone '5mg'$ /day for an "inflamed vein in my neck" per pt. Pt denies nausea, pain or shortness of breath.  Pt A&Ox4, NAD noted on arrival.

## 2022-01-22 ENCOUNTER — Encounter (HOSPITAL_COMMUNITY): Payer: Self-pay

## 2022-01-22 ENCOUNTER — Emergency Department (HOSPITAL_COMMUNITY): Payer: PPO

## 2022-01-22 LAB — BASIC METABOLIC PANEL
Anion gap: 10 (ref 5–15)
BUN: 10 mg/dL (ref 8–23)
CO2: 23 mmol/L (ref 22–32)
Calcium: 8.8 mg/dL — ABNORMAL LOW (ref 8.9–10.3)
Chloride: 112 mmol/L — ABNORMAL HIGH (ref 98–111)
Creatinine, Ser: 0.73 mg/dL (ref 0.44–1.00)
GFR, Estimated: >60 mL/min (ref >60)
Glucose, Bld: 101 mg/dL — ABNORMAL HIGH (ref 70–99)
Potassium: 2.9 mmol/L — ABNORMAL LOW (ref 3.5–5.1)
Sodium: 145 mmol/L (ref 135–145)

## 2022-01-22 LAB — CBC WITH DIFFERENTIAL/PLATELET
Abs Immature Granulocytes: 0.03 10*3/uL (ref 0.00–0.07)
Basophils Absolute: 0 10*3/uL (ref 0.0–0.1)
Basophils Relative: 1 %
Eosinophils Absolute: 0.2 10*3/uL (ref 0.0–0.5)
Eosinophils Relative: 2 %
HCT: 42.8 % (ref 36.0–46.0)
Hemoglobin: 14.2 g/dL (ref 12.0–15.0)
Immature Granulocytes: 0 %
Lymphocytes Relative: 29 %
Lymphs Abs: 2.2 10*3/uL (ref 0.7–4.0)
MCH: 32.6 pg (ref 26.0–34.0)
MCHC: 33.2 g/dL (ref 30.0–36.0)
MCV: 98.2 fL (ref 80.0–100.0)
Monocytes Absolute: 0.6 10*3/uL (ref 0.1–1.0)
Monocytes Relative: 8 %
Neutro Abs: 4.4 10*3/uL (ref 1.7–7.7)
Neutrophils Relative %: 60 %
Platelets: 242 10*3/uL (ref 150–400)
RBC: 4.36 MIL/uL (ref 3.87–5.11)
RDW: 13.5 % (ref 11.5–15.5)
WBC: 7.4 10*3/uL (ref 4.0–10.5)
nRBC: 0 % (ref 0.0–0.2)

## 2022-01-22 LAB — MAGNESIUM: Magnesium: 2.1 mg/dL (ref 1.7–2.4)

## 2022-01-22 MED ORDER — POTASSIUM CHLORIDE CRYS ER 20 MEQ PO TBCR: 40.0000 meq | EXTENDED_RELEASE_TABLET | Freq: Two times a day (BID) | ORAL | 0 refills | Status: AC

## 2022-01-22 MED ORDER — POTASSIUM CHLORIDE 10 MEQ/100ML IV SOLN
10.0000 meq | Freq: Once | INTRAVENOUS | Status: AC
  Administered 2022-01-22: 10 meq via INTRAVENOUS
  Filled 2022-01-22: qty 100

## 2022-01-22 MED ORDER — POTASSIUM CHLORIDE CRYS ER 20 MEQ PO TBCR
40.0000 meq | EXTENDED_RELEASE_TABLET | Freq: Once | ORAL | Status: AC
  Administered 2022-01-22: 40 meq via ORAL
  Filled 2022-01-22: qty 2

## 2022-01-22 MED ORDER — SODIUM CHLORIDE 0.9 % IV BOLUS
1000.0000 mL | Freq: Once | INTRAVENOUS | Status: AC
  Administered 2022-01-22: 1000 mL via INTRAVENOUS

## 2022-01-22 NOTE — ED Provider Notes (Signed)
[facility] Emergency Department Provider Note MRN:  [id number]  Arrival date & time: 01/22/22     Chief Complaint   Tachycardia   History of Present Illness   Mindy Wilson is a 71 y.o. year-old female presents to the ED with chief complaint of palpitations that started tonight at 10pm.  Has hx of CAP with respiratory failure, malnutrition, and chronic bronchitis.  States that she first noticed symptoms tonight at 10pm when going to bed.  Denies having had chest pain, just felt racing heart.  Denies being anticoagulated or prior DVT/PE.  She denies any episodes similar to this ever.  She denies recent illnesses.  Denies having had chest pain.  History provided by patient.   Review of Systems  Pertinent review of systems noted in HPI.    Physical Exam   Vitals:   01/22/22 0100 01/22/22 0115  BP: 134/78 128/81  Pulse: 87 77  Resp: (!) 27 17  Temp:    SpO2: 96% 100%    CONSTITUTIONAL:  well-appearing, NAD NEURO:  Alert and oriented x 3, CN 3-12 grossly intact EYES:  eyes equal and reactive ENT/NECK:  Supple, no stridor  CARDIO:  tachycardic, irregularly irregular, appears well-perfused  PULM:  No respiratory distress, CTAB GI/GU:  non-distended,  MSK/SPINE:  No gross deformities, no edema, moves all extremities  SKIN:  no rash, atraumatic   *Additional and/or pertinent findings included in MDM below  Diagnostic and Interventional Summary    EKG Interpretation  Date/Time:    Ventricular Rate:    PR Interval:    QRS Duration:   QT Interval:    QTC Calculation:   R Axis:     Text Interpretation:         Labs Reviewed  BASIC METABOLIC PANEL - Abnormal; Notable for the following components:      Result Value   Potassium 2.9 (*)    Chloride 112 (*)    Glucose, Bld 101 (*)    Calcium 8.8 (*)    All other components within normal limits  CBC WITH DIFFERENTIAL/PLATELET  MAGNESIUM    DG Chest Port 1 View  Final Result       Medications  potassium chloride 10 mEq in 100 mL IVPB (10 mEq Intravenous New Bag/Given 01/22/22 0117)  sodium chloride 0.9 % bolus 1,000 mL (0 mLs Intravenous Stopped 01/22/22 0113)  potassium chloride SA (KLOR-CON M) CR tablet 40 mEq (40 mEq Oral Given 01/22/22 0117)     Procedures  /  Critical Care Procedures  ED Course and Medical Decision Making  I have reviewed the triage vital signs, the nursing notes, and pertinent available records from the EMR.  Social Determinants Affecting Complexity of Care: Patient has significant smoking history.   ED Course:   Patient here with racing hear sensation.  Top differential diagnoses include palpitations, dysrhythmia, electrolyte abnormality. Medical Decision Making Patient here with palpitations.  Noted to be in A-fib with RVR on triage EKG with rates in the 160s and 170s.  Patient denies any chest pain.  Onset of symptoms was at 10 PM tonight.  She denies having ever experienced anything like this previously.  She denies any shortness of breath.  Denies being anticoagulated or prior PE or DVT.  Will start fluids and consider cardioversion.  At approximately 12:07 AM, patient spontaneously converted back to normal sinus rhythm.  Her potassium is noted to be slightly low at 2.9, as discussed below.  Also, patient is taking prednisone for occipital  neuralgia.  Both the prednisone and potassium could be what triggered her A-fib.  We will have patient follow-up in the A-fib clinic.  CHAD2VASC - 2, will defer starting anticoagulation as we have a reasonable explanation for cause and are optimistic she won't go back into a-fib.    Amount and/or Complexity of Data Reviewed Labs: ordered.    Details: Potassium noted to be 2.9, will supplement with 10 mEq of IV potassium and 40 mEq p.o. potassium.  Magnesium level ordered.  This could be a potential trigger for the A-fib. Radiology: ordered.  Risk Prescription drug management. Decision regarding  hospitalization.     Consultants: No consultations were needed in caring for this patient.   Treatment and Plan: I considered admission due to patient's initial presentation, but after considering the examination and diagnostic results, patient will not require admission and can be discharged with outpatient follow-up.  Patient seen by and discussed with attending physician, Dr. Leonette Monarch, who agrees with workup and management plan.  Follow-up in A-fib clinic to assess further intervention/workup.  Final Clinical Impressions(s) / ED Diagnoses     ICD-10-CM   1. Atrial fibrillation with RVR [facility])  I48.91       ED Discharge Orders          Ordered    Amb referral to AFIB Clinic        01/22/22 0206              Discharge Instructions Discussed with and Provided to Patient:     Discharge Instructions      Please follow-up in the A-fib clinic.  Return for new or worsening symptoms.       Montine Circle, PA-C 01/22/22 9798    Fatima Blank, MD 01/24/22 1258

## 2022-01-22 NOTE — ED Notes (Signed)
Pt noted to be in NSR. EKG obtained and given to MD.

## 2022-01-22 NOTE — Discharge Instructions (Signed)
Please follow-up in the A-fib clinic.  Return for new or worsening symptoms.

## 2022-01-24 ENCOUNTER — Ambulatory Visit (HOSPITAL_COMMUNITY)
Admission: RE | Admit: 2022-01-24 | Discharge: 2022-01-24 | Disposition: A | Discharge status: Home / Self Care | Payer: PPO | Source: Ambulatory Visit | Attending: Nurse Practitioner | Admitting: Nurse Practitioner

## 2022-01-24 VITALS — BP 164/86 | HR 68 | Ht 64.0 in | Wt 95.2 lb

## 2022-01-24 DIAGNOSIS — I773 Arterial fibromuscular dysplasia: Secondary | ICD-10-CM

## 2022-01-24 DIAGNOSIS — F1090 Alcohol use, unspecified, uncomplicated: Secondary | ICD-10-CM | POA: Diagnosis not present

## 2022-01-24 DIAGNOSIS — E876 Hypokalemia: Secondary | ICD-10-CM | POA: Insufficient documentation

## 2022-01-24 DIAGNOSIS — Z87891 Personal history of nicotine dependence: Secondary | ICD-10-CM | POA: Insufficient documentation

## 2022-01-24 DIAGNOSIS — R030 Elevated blood-pressure reading, without diagnosis of hypertension: Secondary | ICD-10-CM | POA: Insufficient documentation

## 2022-01-24 DIAGNOSIS — I4891 Unspecified atrial fibrillation: Secondary | ICD-10-CM | POA: Insufficient documentation

## 2022-01-24 LAB — BASIC METABOLIC PANEL
Anion gap: 7 (ref 5–15)
BUN: 13 mg/dL (ref 8–23)
CO2: 27 mmol/L (ref 22–32)
Calcium: 10 mg/dL (ref 8.9–10.3)
Chloride: 107 mmol/L (ref 98–111)
Creatinine, Ser: 0.83 mg/dL (ref 0.44–1.00)
GFR, Estimated: >60 mL/min (ref >60)
Glucose, Bld: 89 mg/dL (ref 70–99)
Potassium: 4.6 mmol/L (ref 3.5–5.1)
Sodium: 141 mmol/L (ref 135–145)

## 2022-01-24 NOTE — Progress Notes (Signed)
Primary Care Physician: Leonard Downing, MD Referring Physician:ED f/u    Mindy Wilson is a 71 y.o. female with a h/o new onset afib after presenting to the ER with palpitations 01/22/22. Her potassium was low at 2.9 and repleted.  After several hours in the ER, she spontaneously converted to SR she was also noted to be on prednisone for occipital neuralgia. It was felt the electrolyte imbalance also with steroid use is what contributed to  the onset of afib. It was decided to defer anticoagulation with a CHA2DS2VASc  score of 2.  She does have a significant smoking history. Drinks 4 beers a night. She did stop prednisone  with input from  PCP but now has the return of  headache. No snoring history per the husband. She drinks around 2 servings of caffeine a day.   Today, she denies symptoms of palpitations, chest pain, shortness of breath, orthopnea, PND, lower extremity edema, dizziness, presyncope, syncope, or neurologic sequela. The patient is tolerating medications without difficulties and is otherwise without complaint today.   Past Medical History:  Diagnosis Date   Complication of anesthesia    bp dropped with d/c   Contact lens/glasses fitting    Hernia    No pertinent past medical history    Osteoporosis    PONV (postoperative nausea and vomiting)    Wears glasses    Past Surgical History:  Procedure Laterality Date   CYST EXCISION     rt groin   CYSTECTOMY  1995   DILATION AND CURETTAGE OF UTERUS  1990   INGUINAL HERNIA REPAIR  06/25/2012   Procedure: HERNIA REPAIR INGUINAL ADULT;  Surgeon: Rolm Bookbinder, MD;  Location: [place] [facility];  Service: General;  Laterality: Left;   INSERTION OF MESH  06/25/2012   Procedure: INSERTION OF MESH;  Surgeon: Rolm Bookbinder, MD;  Location: [place];  Service: General;  Laterality: Left;   TONSILLECTOMY      Current Outpatient Medications  Medication Sig Dispense Refill   buPROPion (WELLBUTRIN SR)  150 MG 12 hr tablet One daily with bfast 30 tablet 0   potassium chloride SA (KLOR-CON M) 20 MEQ tablet Take 2 tablets (40 mEq total) by mouth 2 (two) times daily. 6 tablet 0   No current facility-administered medications for this encounter.    Allergies  Allergen Reactions   Elemental Sulfur Nausea And Vomiting    Social History   Socioeconomic History   Marital status: Married    Spouse name: Not on file   Number of children: Not on file   Years of education: Not on file   Highest education level: Not on file  Occupational History   Occupation: Financial planner  Tobacco Use   Smoking status: Former    Packs/day: 1.00    Years: 40.00    Total pack years: 40.00    Types: Cigarettes    Quit date: 05/05/2013    Years since quitting: 8.7   Smokeless tobacco: Never  Vaping Use   Vaping Use: Never used  Substance and Sexual Activity   Alcohol use: Yes    Comment: very little   Drug use: No   Sexual activity: Not on file  Other Topics Concern   Not on file  Social History Narrative   Not on file   Social Determinants of Health   Financial Resource Strain: Not on file  Food Insecurity: Not on file  Transportation Needs: Not on file  Physical Activity: Not on  file  Stress: Not on file  Social Connections: Not on file  Intimate Partner Violence: Not on file    Family History  Problem Relation Age of Onset   Breast cancer Mother    Colon cancer Maternal Uncle     ROS- All systems are reviewed and negative except as per the HPI above  Physical Exam: Vitals:   01/24/22 1455  Weight: 43.2 kg  Height: '5\' 4"'$  (1.626 m)   Wt Readings from Last 3 Encounters:  01/24/22 43.2 kg  01/21/22 52.2 kg  08/13/13 49.9 kg    Labs: Lab Results  Component Value Date   NA 145 01/22/2022   K 2.9 (L) 01/22/2022   CL 112 (H) 01/22/2022   CO2 23 01/22/2022   GLUCOSE 101 (H) 01/22/2022   BUN 10 01/22/2022   CREATININE 0.73 01/22/2022   CALCIUM 8.8 (L) 01/22/2022   MG 2.1  01/22/2022   No results found for: "INR" No results found for: "CHOL", "HDL", "[place]", "TRIG"   GEN- The patient is well appearing, alert and oriented x 3 today.   Head- normocephalic, atraumatic Eyes-  Sclera clear, conjunctiva pink Ears- hearing intact Oropharynx- clear Neck- supple, no JVP Lymph- no cervical lymphadenopathy Lungs- Clear to ausculation bilaterally, normal work of breathing Heart- Regular rate and rhythm, no murmurs, rubs or gallops, PMI not laterally displaced GI- soft, NT, ND, + BS Extremities- no clubbing, cyanosis, or edema MS- no significant deformity or atrophy Skin- no rash or lesion Psych- euthymic mood, full affect Neuro- strength and sensation are intact  EKG-afib in the ER by ekg at 161 bpm reviewed  Ekg today shows- PR interval 146 ms QRS duration 74 ms QT/QTcB 374/397 ms P-R-T axes 83 71 77 Normal sinus rhythm Minimal voltage criteria for LVH, may be normal variant ( Sokolow-Lyon ) Possible Anteroseptal infarct , age undetermined Abnormal ECG When compared with ECG of 22-Jan-2022 00:26, PREVIOUS ECG IS PRESENT    Assessment and Plan:  1. Afib New onset with spontaneous conversion in the setting of hypokalemia and prednisone use   General education re afib  Triggers discussed  Advised to stop smoking and alcohol  Echo ordered  Anticoagulation deferred in the ER for a CHA2DS2VASc  of 2  2. Hypokalemia  Repleted in the ED Bmet today   3. Elevated BP States better at home Check at home and let me know if consistently elevated above 528 systolic, may need antihypertensives.  If HTN is found, will then have a CHA2DS2VASc  score of 3 and anticoagulation would need to be reconsidered  F/u with PCP and afib clinic as needed  Results with be called to pt and f/u plan  Butch Penny C. Charitie Hinote, [facility] [address] [place], [place] [address] [phone]

## 2022-02-07 ENCOUNTER — Ambulatory Visit (HOSPITAL_COMMUNITY)
Admission: RE | Admit: 2022-02-07 | Discharge: 2022-02-07 | Disposition: A | Discharge status: Home / Self Care | Payer: PPO | Source: Ambulatory Visit | Attending: Nurse Practitioner | Admitting: Nurse Practitioner

## 2022-02-07 DIAGNOSIS — I4891 Unspecified atrial fibrillation: Secondary | ICD-10-CM | POA: Insufficient documentation

## 2022-02-07 DIAGNOSIS — F17200 Nicotine dependence, unspecified, uncomplicated: Secondary | ICD-10-CM | POA: Insufficient documentation

## 2022-02-07 DIAGNOSIS — J9601 Acute respiratory failure with hypoxia: Secondary | ICD-10-CM | POA: Insufficient documentation

## 2022-02-07 LAB — ECHOCARDIOGRAM COMPLETE
Area-P 1/2: 3.48 cm2
S' Lateral: 2.6 cm

## 2022-02-07 NOTE — Progress Notes (Signed)
  Echocardiogram 2D Echocardiogram has been performed.  Mindy Wilson M 02/07/2022, 3:06 PM

## 2022-02-08 ENCOUNTER — Encounter (HOSPITAL_COMMUNITY): Payer: Self-pay | Admitting: *Deleted

## 2022-08-30 ENCOUNTER — Encounter (HOSPITAL_COMMUNITY): Payer: Self-pay | Admitting: *Deleted

## 2023-06-23 DEATH — deceased
